# Patient Record
Sex: Female | Born: 1970
Health system: Southern US, Community
[De-identification: ages and names within clinical notes are randomized; demographics above are authoritative.]

## PROBLEM LIST (undated history)

## (undated) ENCOUNTER — Ambulatory Visit: Admission: EM | Payer: Medicare Other | Source: Home / Self Care

## (undated) DIAGNOSIS — R5382 Chronic fatigue, unspecified: Secondary | ICD-10-CM

## (undated) DIAGNOSIS — G9332 Myalgic encephalomyelitis/chronic fatigue syndrome: Secondary | ICD-10-CM

## (undated) DIAGNOSIS — F32A Depression, unspecified: Secondary | ICD-10-CM

## (undated) DIAGNOSIS — J45909 Unspecified asthma, uncomplicated: Secondary | ICD-10-CM

## (undated) DIAGNOSIS — M797 Fibromyalgia: Secondary | ICD-10-CM

## (undated) DIAGNOSIS — L292 Pruritus vulvae: Secondary | ICD-10-CM

## (undated) DIAGNOSIS — N870 Mild cervical dysplasia: Secondary | ICD-10-CM

## (undated) DIAGNOSIS — F419 Anxiety disorder, unspecified: Secondary | ICD-10-CM

## (undated) DIAGNOSIS — F329 Major depressive disorder, single episode, unspecified: Secondary | ICD-10-CM

## (undated) DIAGNOSIS — K649 Unspecified hemorrhoids: Secondary | ICD-10-CM

## (undated) HISTORY — DX: Unspecified asthma, uncomplicated: J45.909

## (undated) HISTORY — DX: Anxiety disorder, unspecified: F41.9

## (undated) HISTORY — DX: Fibromyalgia: M79.7

## (undated) HISTORY — DX: Chronic fatigue, unspecified: R53.82

## (undated) HISTORY — DX: Major depressive disorder, single episode, unspecified: F32.9

## (undated) HISTORY — DX: Myalgic encephalomyelitis/chronic fatigue syndrome: G93.32

## (undated) HISTORY — DX: Unspecified hemorrhoids: K64.9

## (undated) HISTORY — DX: Depression, unspecified: F32.A

## (undated) HISTORY — DX: Pruritus vulvae: L29.2

## (undated) HISTORY — DX: Mild cervical dysplasia: N87.0

---

## 1995-11-12 HISTORY — PX: DILATION AND CURETTAGE OF UTERUS: SHX78

## 2000-11-11 DIAGNOSIS — N870 Mild cervical dysplasia: Secondary | ICD-10-CM

## 2000-11-11 HISTORY — PX: COLPOSCOPY: SHX161

## 2000-11-11 HISTORY — DX: Mild cervical dysplasia: N87.0

## 2004-01-27 ENCOUNTER — Ambulatory Visit: Payer: Self-pay | Admitting: Psychology

## 2004-02-22 ENCOUNTER — Ambulatory Visit: Payer: Self-pay | Admitting: Psychology

## 2004-03-26 ENCOUNTER — Other Ambulatory Visit: Admission: RE | Admit: 2004-03-26 | Discharge: 2004-03-26 | Payer: Self-pay | Admitting: Obstetrics and Gynecology

## 2004-04-09 ENCOUNTER — Ambulatory Visit: Payer: Self-pay | Admitting: Psychology

## 2004-04-24 ENCOUNTER — Ambulatory Visit: Payer: Self-pay | Admitting: Psychology

## 2004-05-17 ENCOUNTER — Ambulatory Visit: Payer: Self-pay | Admitting: Psychology

## 2004-06-14 ENCOUNTER — Ambulatory Visit: Payer: Self-pay | Admitting: Psychology

## 2004-07-17 ENCOUNTER — Ambulatory Visit: Payer: Self-pay | Admitting: Psychology

## 2004-08-14 ENCOUNTER — Ambulatory Visit: Payer: Self-pay | Admitting: Psychology

## 2004-09-18 ENCOUNTER — Ambulatory Visit: Payer: Self-pay | Admitting: Psychology

## 2004-10-12 ENCOUNTER — Ambulatory Visit: Payer: Self-pay | Admitting: Psychology

## 2004-10-18 ENCOUNTER — Encounter: Admission: RE | Admit: 2004-10-18 | Discharge: 2004-10-18 | Payer: Self-pay | Admitting: Rheumatology

## 2005-03-08 ENCOUNTER — Ambulatory Visit: Payer: Self-pay | Admitting: Psychology

## 2005-04-22 ENCOUNTER — Emergency Department (HOSPITAL_COMMUNITY): Admission: EM | Admit: 2005-04-22 | Discharge: 2005-04-23 | Payer: Self-pay | Admitting: Emergency Medicine

## 2005-05-21 ENCOUNTER — Ambulatory Visit: Payer: Self-pay | Admitting: Psychology

## 2005-06-25 ENCOUNTER — Ambulatory Visit: Payer: Self-pay | Admitting: Psychology

## 2005-08-14 ENCOUNTER — Ambulatory Visit: Payer: Self-pay | Admitting: Psychology

## 2005-09-19 ENCOUNTER — Ambulatory Visit: Payer: Self-pay | Admitting: Psychology

## 2005-11-18 ENCOUNTER — Ambulatory Visit: Payer: Self-pay | Admitting: Psychology

## 2006-06-12 ENCOUNTER — Ambulatory Visit (HOSPITAL_COMMUNITY): Payer: Self-pay | Admitting: Psychology

## 2006-12-15 ENCOUNTER — Ambulatory Visit (HOSPITAL_COMMUNITY): Payer: Self-pay | Admitting: Psychology

## 2007-11-24 ENCOUNTER — Ambulatory Visit (HOSPITAL_COMMUNITY): Payer: Self-pay | Admitting: Psychology

## 2008-03-16 ENCOUNTER — Ambulatory Visit (HOSPITAL_COMMUNITY): Payer: Self-pay | Admitting: Psychology

## 2008-05-25 ENCOUNTER — Ambulatory Visit (HOSPITAL_COMMUNITY): Payer: Self-pay | Admitting: Psychology

## 2009-01-31 ENCOUNTER — Encounter: Payer: Self-pay | Admitting: Gynecology

## 2009-01-31 ENCOUNTER — Other Ambulatory Visit: Admission: RE | Admit: 2009-01-31 | Discharge: 2009-01-31 | Payer: Self-pay | Admitting: Gynecology

## 2009-01-31 ENCOUNTER — Ambulatory Visit: Payer: Self-pay | Admitting: Gynecology

## 2009-03-13 ENCOUNTER — Ambulatory Visit (HOSPITAL_COMMUNITY): Payer: Self-pay | Admitting: Psychology

## 2009-05-11 ENCOUNTER — Ambulatory Visit (HOSPITAL_COMMUNITY): Payer: Self-pay | Admitting: Psychology

## 2009-10-18 ENCOUNTER — Ambulatory Visit (HOSPITAL_COMMUNITY): Payer: Self-pay | Admitting: Psychology

## 2009-10-18 ENCOUNTER — Emergency Department (HOSPITAL_COMMUNITY): Admission: EM | Admit: 2009-10-18 | Discharge: 2009-10-18 | Payer: Self-pay | Admitting: Family Medicine

## 2010-01-12 ENCOUNTER — Ambulatory Visit (HOSPITAL_COMMUNITY): Payer: Self-pay | Admitting: Psychology

## 2010-04-11 ENCOUNTER — Ambulatory Visit (HOSPITAL_COMMUNITY): Payer: Self-pay | Admitting: Psychology

## 2010-05-23 ENCOUNTER — Ambulatory Visit: Payer: Self-pay | Admitting: Gynecology

## 2010-05-23 ENCOUNTER — Other Ambulatory Visit: Admission: RE | Admit: 2010-05-23 | Discharge: 2010-05-23 | Payer: Self-pay | Admitting: Gynecology

## 2010-06-13 ENCOUNTER — Ambulatory Visit (HOSPITAL_COMMUNITY): Payer: Self-pay | Admitting: Psychology

## 2010-09-07 ENCOUNTER — Ambulatory Visit (HOSPITAL_COMMUNITY): Payer: Self-pay | Admitting: Psychology

## 2010-09-26 ENCOUNTER — Ambulatory Visit: Payer: Self-pay | Admitting: Gynecology

## 2010-11-13 ENCOUNTER — Ambulatory Visit (HOSPITAL_COMMUNITY)
Admission: RE | Admit: 2010-11-13 | Discharge: 2010-11-13 | Payer: Self-pay | Source: Home / Self Care | Attending: Psychology | Admitting: Psychology

## 2010-11-19 ENCOUNTER — Ambulatory Visit (HOSPITAL_COMMUNITY)
Admission: RE | Admit: 2010-11-19 | Discharge: 2010-11-19 | Payer: Self-pay | Source: Home / Self Care | Attending: Gynecology | Admitting: Gynecology

## 2010-11-20 ENCOUNTER — Ambulatory Visit (HOSPITAL_COMMUNITY)
Admission: RE | Admit: 2010-11-20 | Discharge: 2010-11-20 | Payer: Self-pay | Source: Home / Self Care | Attending: Psychology | Admitting: Psychology

## 2010-12-24 ENCOUNTER — Encounter (HOSPITAL_COMMUNITY): Payer: Self-pay | Admitting: Psychology

## 2011-02-12 ENCOUNTER — Encounter (INDEPENDENT_AMBULATORY_CARE_PROVIDER_SITE_OTHER): Payer: Self-pay | Admitting: Psychology

## 2011-02-12 DIAGNOSIS — F068 Other specified mental disorders due to known physiological condition: Secondary | ICD-10-CM

## 2011-02-12 DIAGNOSIS — F39 Unspecified mood [affective] disorder: Secondary | ICD-10-CM

## 2011-02-12 LAB — POCT RAPID STREP A (OFFICE): Streptococcus, Group A Screen (Direct): NEGATIVE

## 2011-03-22 ENCOUNTER — Other Ambulatory Visit: Payer: Self-pay | Admitting: Dermatology

## 2011-06-10 ENCOUNTER — Encounter (INDEPENDENT_AMBULATORY_CARE_PROVIDER_SITE_OTHER): Payer: Medicare Other | Admitting: Psychology

## 2011-06-10 DIAGNOSIS — F39 Unspecified mood [affective] disorder: Secondary | ICD-10-CM

## 2011-06-10 DIAGNOSIS — F068 Other specified mental disorders due to known physiological condition: Secondary | ICD-10-CM

## 2011-07-02 ENCOUNTER — Ambulatory Visit (INDEPENDENT_AMBULATORY_CARE_PROVIDER_SITE_OTHER): Payer: Medicare Other | Admitting: Gynecology

## 2011-07-02 ENCOUNTER — Other Ambulatory Visit (HOSPITAL_COMMUNITY)
Admission: RE | Admit: 2011-07-02 | Discharge: 2011-07-02 | Disposition: A | Discharge status: Home / Self Care | Payer: Medicare Other | Source: Ambulatory Visit | Attending: Gynecology | Admitting: Gynecology

## 2011-07-02 ENCOUNTER — Encounter: Payer: Self-pay | Admitting: Gynecology

## 2011-07-02 VITALS — BP 110/74 | Ht 63.0 in | Wt 150.0 lb

## 2011-07-02 DIAGNOSIS — F411 Generalized anxiety disorder: Secondary | ICD-10-CM

## 2011-07-02 DIAGNOSIS — Z9189 Other specified personal risk factors, not elsewhere classified: Secondary | ICD-10-CM

## 2011-07-02 DIAGNOSIS — Z131 Encounter for screening for diabetes mellitus: Secondary | ICD-10-CM

## 2011-07-02 DIAGNOSIS — F419 Anxiety disorder, unspecified: Secondary | ICD-10-CM

## 2011-07-02 DIAGNOSIS — Z01419 Encounter for gynecological examination (general) (routine) without abnormal findings: Secondary | ICD-10-CM

## 2011-07-02 DIAGNOSIS — E663 Overweight: Secondary | ICD-10-CM

## 2011-07-02 DIAGNOSIS — Z124 Encounter for screening for malignant neoplasm of cervix: Secondary | ICD-10-CM | POA: Insufficient documentation

## 2011-07-02 DIAGNOSIS — Z87410 Personal history of cervical dysplasia: Secondary | ICD-10-CM

## 2011-07-02 DIAGNOSIS — Z8669 Personal history of other diseases of the nervous system and sense organs: Secondary | ICD-10-CM

## 2011-07-02 DIAGNOSIS — R635 Abnormal weight gain: Secondary | ICD-10-CM

## 2011-07-02 DIAGNOSIS — N9089 Other specified noninflammatory disorders of vulva and perineum: Secondary | ICD-10-CM

## 2011-07-02 DIAGNOSIS — Z13220 Encounter for screening for lipoid disorders: Secondary | ICD-10-CM

## 2011-07-02 MED ORDER — ESCITALOPRAM OXALATE 10 MG PO TABS: 10.0000 mg | ORAL_TABLET | Freq: Every day | ORAL | Status: DC

## 2011-07-02 NOTE — Progress Notes (Signed)
Cristina Soto June 08, 1971 [id number]        40 y.o.  for followup.  Recent had an episode where she had some reddish lesions on her skin ultimately saw infectious disease, thought that they were ticks but turned out that they were body lice. She then had followup episodes of crystalline-like mucus passage for which the infectious disease thought were psychogenic in nature. She's doing well now with everything resolved.  She does note migraine headaches that are perimenstrual the she's had in the past and she takes Excedrin headache and that seems to take care of it.  They're not associated with aura or other neurologic symptoms. She is also complaining of weight gain 10-15 pounds in the past year but admits to not exercising on a regular basis. She has a skin tag on her vulva she wants to take a look at lastly she is on Lexapro 10 mg daily for anxiety it's working well and she wants to continue on this.  Clarifying her history she does have a history of abnormal Pap smears in the past done at another office approximately 10 years ago which were low-grade dysplasia and she had no significant treatment for them. Her most recent followup Pap smears have all been normal.  Past medical history,surgical history, allergies, family history and social history were all reviewed and documented in the EPIC chart. ROS:  Was performed and pertinent positives and negatives are included in the history.  Exam: chaperone present Filed Vitals:   07/02/11 1409  BP: 110/74   General appearance  Normal Skin grossly normal Head/Neck normal with no cervical or supraclavicular adenopathy thyroid normal Lungs  clear Cardiac RR, without RMG Abdominal  soft, nontender, without masses, organomegaly or hernia Breasts  examined lying and sitting without masses, retractions, discharge or axillary adenopathy. Pelvic  Ext/BUS/vagina  normal small benign appearing skin tag in the left underwear line   Cervix  normal  Pap  done  Uterus  anteverted, normal size, shape and contour, midline and mobile nontender   Adnexa  Without masses or tenderness    Anus and perineum  normal   Rectovaginal  normal sphincter tone without palpated masses or tenderness.    Assessment/Plan:  40 y.o. female for followup.    #1 Weight gain.  Think this is due to lack of exercise and dietary. Recommend TSH rule out thyroid dysfunction but otherwise increase activity with dietary restriction. #2 Skin tag. Patient has benign appearing skin tag left underwear line. It's been stable over observation. Offered excision if she wants otherwise she'll monitor as long as he remains stable she'll follow. #3 Anxiety. Patient is doing well from her posttraumatic stress syndrome is on Lexapro 10 mg daily. I refilled her times a year. #4 Migraine headaches. Patient has perimenstrual migraine headaches that respond to Excedrin headache. No aura or other neurologic symptoms. She has been seen by psychiatry neurology in the past does not feel needs any further evaluation at this time. #5 Health maintenance. Self breast exams on a monthly basis discussed encouraged. Had mammogram January 2012 we'll continue with annual mammograms. Is not sexually active but plans to be in birth control is not an issue. Menses are regular and manageable. Pap smear was done today. She has a history of dysplasia in the past noting most recent Pap smears have all been normal assumed this was normal we'll continue annual cytology.Baseline labs CBC urine analysis glucose lipid profile were ordered along with her TSH. Cristina Soto continues well she'll see  me in a year sooner as needed.     Cristina Lords MD, 3:06 PM 07/02/2011

## 2011-08-12 ENCOUNTER — Encounter (INDEPENDENT_AMBULATORY_CARE_PROVIDER_SITE_OTHER): Payer: Medicare Other | Admitting: Psychology

## 2011-08-12 DIAGNOSIS — F0630 Mood disorder due to known physiological condition, unspecified: Secondary | ICD-10-CM

## 2011-08-12 DIAGNOSIS — F39 Unspecified mood [affective] disorder: Secondary | ICD-10-CM

## 2011-08-27 ENCOUNTER — Encounter (INDEPENDENT_AMBULATORY_CARE_PROVIDER_SITE_OTHER): Payer: MEDICARE | Admitting: Psychology

## 2011-08-27 DIAGNOSIS — F068 Other specified mental disorders due to known physiological condition: Secondary | ICD-10-CM

## 2011-08-27 DIAGNOSIS — F39 Unspecified mood [affective] disorder: Secondary | ICD-10-CM

## 2011-11-20 ENCOUNTER — Ambulatory Visit (INDEPENDENT_AMBULATORY_CARE_PROVIDER_SITE_OTHER): Payer: Medicare Other | Admitting: Psychology

## 2011-11-20 DIAGNOSIS — F064 Anxiety disorder due to known physiological condition: Secondary | ICD-10-CM

## 2011-11-20 DIAGNOSIS — F068 Other specified mental disorders due to known physiological condition: Secondary | ICD-10-CM

## 2011-11-20 DIAGNOSIS — F39 Unspecified mood [affective] disorder: Secondary | ICD-10-CM

## 2011-11-20 DIAGNOSIS — R69 Illness, unspecified: Secondary | ICD-10-CM

## 2011-11-21 ENCOUNTER — Encounter (HOSPITAL_COMMUNITY): Payer: Self-pay | Admitting: Psychology
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
## 2012-02-06 ENCOUNTER — Ambulatory Visit (INDEPENDENT_AMBULATORY_CARE_PROVIDER_SITE_OTHER): Payer: Medicare Other | Admitting: Psychology

## 2012-02-06 DIAGNOSIS — F068 Other specified mental disorders due to known physiological condition: Secondary | ICD-10-CM

## 2012-02-06 DIAGNOSIS — F39 Unspecified mood [affective] disorder: Secondary | ICD-10-CM

## 2012-02-06 DIAGNOSIS — F064 Anxiety disorder due to known physiological condition: Secondary | ICD-10-CM

## 2012-04-01 ENCOUNTER — Ambulatory Visit (INDEPENDENT_AMBULATORY_CARE_PROVIDER_SITE_OTHER): Payer: Medicare Other | Admitting: Gynecology

## 2012-04-01 ENCOUNTER — Encounter: Payer: Self-pay | Admitting: Gynecology

## 2012-04-01 ENCOUNTER — Encounter (HOSPITAL_COMMUNITY): Payer: Self-pay | Admitting: Psychology

## 2012-04-01 DIAGNOSIS — K649 Unspecified hemorrhoids: Secondary | ICD-10-CM | POA: Insufficient documentation
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
## 2012-04-01 NOTE — Progress Notes (Signed)
Patient is a 41 year old who presented to the office today complaining of one-month history of "hemorrhoids". She thought she felt some bulging or sensation in her vaginal area when straining. She denies any hematochezia. On questioning she has 2 family members who have had diverticulitis (father, sister) which resulted in hemicolectomy. Patient stated that she applied several times Preparation H and wanted just to make checked to make sure that was fine. She's not sexually active and is having normal menstrual cycles.  Exam: Pelvic: Bartholin urethra Skene was within normal limits Vagina: No lesions or discharge no evidence of Bartholin cyst Cervix: No lesions or discharge Bimanual exam no palpable masses or tenderness uterus anteverted Rectal: Patient was placed in the knee-chest position and underwent an anoscopic exam with no evidence of internal or external hemorrhoids.  Assessment/plan: Patient with apparent past history of hemorrhoids none present on exam today. She may have treated her self are with Preparation H. We discussed taking MiraLax 1 tablespoon with juice daily and to continue to add fiber, fruit and vegetable as part of her daily diet and to maintain good hydration. She was given Hemoccult cards to submit to the office for testing.

## 2012-04-01 NOTE — Patient Instructions (Signed)

## 2012-06-08 ENCOUNTER — Ambulatory Visit (INDEPENDENT_AMBULATORY_CARE_PROVIDER_SITE_OTHER): Payer: Medicare Other | Admitting: Psychology

## 2012-06-08 DIAGNOSIS — F068 Other specified mental disorders due to known physiological condition: Secondary | ICD-10-CM

## 2012-06-08 DIAGNOSIS — F39 Unspecified mood [affective] disorder: Secondary | ICD-10-CM

## 2012-06-08 DIAGNOSIS — F064 Anxiety disorder due to known physiological condition: Secondary | ICD-10-CM

## 2012-07-02 ENCOUNTER — Encounter (HOSPITAL_COMMUNITY): Payer: Self-pay | Admitting: Psychology
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
## 2012-07-15 ENCOUNTER — Ambulatory Visit (INDEPENDENT_AMBULATORY_CARE_PROVIDER_SITE_OTHER): Payer: Medicare Other | Admitting: Psychology

## 2012-07-15 DIAGNOSIS — F068 Other specified mental disorders due to known physiological condition: Secondary | ICD-10-CM

## 2012-07-15 DIAGNOSIS — F39 Unspecified mood [affective] disorder: Secondary | ICD-10-CM

## 2012-07-15 DIAGNOSIS — F064 Anxiety disorder due to known physiological condition: Secondary | ICD-10-CM

## 2012-07-16 ENCOUNTER — Encounter (HOSPITAL_COMMUNITY): Payer: Self-pay | Admitting: Psychology
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
## 2012-09-14 ENCOUNTER — Ambulatory Visit (HOSPITAL_COMMUNITY): Payer: Self-pay | Admitting: Psychology

## 2012-09-21 ENCOUNTER — Ambulatory Visit (INDEPENDENT_AMBULATORY_CARE_PROVIDER_SITE_OTHER): Payer: Medicare Other | Admitting: Psychology

## 2012-09-21 DIAGNOSIS — F39 Unspecified mood [affective] disorder: Secondary | ICD-10-CM

## 2012-09-21 DIAGNOSIS — F064 Anxiety disorder due to known physiological condition: Secondary | ICD-10-CM

## 2012-10-01 ENCOUNTER — Encounter (HOSPITAL_COMMUNITY): Payer: Self-pay | Admitting: Psychology
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
## 2012-12-23 ENCOUNTER — Ambulatory Visit (HOSPITAL_COMMUNITY): Payer: Self-pay | Admitting: Psychology

## 2012-12-28 ENCOUNTER — Ambulatory Visit (INDEPENDENT_AMBULATORY_CARE_PROVIDER_SITE_OTHER): Payer: No Typology Code available for payment source | Admitting: Psychology

## 2012-12-28 ENCOUNTER — Encounter (HOSPITAL_COMMUNITY): Payer: Self-pay | Admitting: Psychology

## 2012-12-28 DIAGNOSIS — F064 Anxiety disorder due to known physiological condition: Secondary | ICD-10-CM

## 2012-12-28 DIAGNOSIS — F39 Unspecified mood [affective] disorder: Secondary | ICD-10-CM
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
## 2013-03-16 ENCOUNTER — Ambulatory Visit (INDEPENDENT_AMBULATORY_CARE_PROVIDER_SITE_OTHER): Payer: No Typology Code available for payment source | Admitting: Psychology

## 2013-03-16 DIAGNOSIS — F39 Unspecified mood [affective] disorder: Secondary | ICD-10-CM

## 2013-03-16 DIAGNOSIS — F064 Anxiety disorder due to known physiological condition: Secondary | ICD-10-CM

## 2013-03-24 ENCOUNTER — Ambulatory Visit (INDEPENDENT_AMBULATORY_CARE_PROVIDER_SITE_OTHER): Payer: No Typology Code available for payment source | Admitting: Psychology

## 2013-03-24 DIAGNOSIS — F39 Unspecified mood [affective] disorder: Secondary | ICD-10-CM

## 2013-04-01 ENCOUNTER — Encounter (HOSPITAL_COMMUNITY): Payer: Self-pay | Admitting: Psychology
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
## 2013-04-19 ENCOUNTER — Encounter (HOSPITAL_COMMUNITY): Payer: Self-pay | Admitting: Psychology
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
## 2013-05-07 ENCOUNTER — Telehealth: Payer: Self-pay

## 2013-05-07 NOTE — Telephone Encounter (Signed)
Patient c/o x one year lump in abdomen at umbilicus.  Feels like she may have blockage of stool. C/o stool passing "with legs" and "in weird shapes".  Feels pressure at bladder and umbilicus and urine foggy.  Also, complained that last month she had two periods. Had regular period and two weeks later had another. I told patient she needs to schedule office visit. Two years since last CE and she needs to be seen to assess all of these symptoms. I transferred her to Lupita Leash to schedule appt. Patient is currently on vacation and will come in when she returns.

## 2013-05-17 ENCOUNTER — Ambulatory Visit (HOSPITAL_COMMUNITY): Payer: Self-pay | Admitting: Psychology

## 2013-05-18 ENCOUNTER — Ambulatory Visit (INDEPENDENT_AMBULATORY_CARE_PROVIDER_SITE_OTHER): Payer: Medicare Other | Admitting: Gynecology

## 2013-05-18 ENCOUNTER — Encounter: Payer: Self-pay | Admitting: Gynecology

## 2013-05-18 ENCOUNTER — Other Ambulatory Visit (HOSPITAL_COMMUNITY)
Admission: RE | Admit: 2013-05-18 | Discharge: 2013-05-18 | Disposition: A | Discharge status: Home / Self Care | Payer: Medicare Other | Source: Ambulatory Visit | Attending: Gynecology | Admitting: Gynecology

## 2013-05-18 VITALS — BP 114/72 | Ht 63.25 in | Wt 147.0 lb

## 2013-05-18 DIAGNOSIS — N926 Irregular menstruation, unspecified: Secondary | ICD-10-CM

## 2013-05-18 DIAGNOSIS — R109 Unspecified abdominal pain: Secondary | ICD-10-CM

## 2013-05-18 DIAGNOSIS — R102 Pelvic and perineal pain: Secondary | ICD-10-CM

## 2013-05-18 DIAGNOSIS — Z124 Encounter for screening for malignant neoplasm of cervix: Secondary | ICD-10-CM

## 2013-05-18 DIAGNOSIS — R195 Other fecal abnormalities: Secondary | ICD-10-CM

## 2013-05-18 DIAGNOSIS — Z01419 Encounter for gynecological examination (general) (routine) without abnormal findings: Secondary | ICD-10-CM | POA: Insufficient documentation

## 2013-05-18 DIAGNOSIS — N9489 Other specified conditions associated with female genital organs and menstrual cycle: Secondary | ICD-10-CM

## 2013-05-18 DIAGNOSIS — Z13220 Encounter for screening for lipoid disorders: Secondary | ICD-10-CM

## 2013-05-18 LAB — CBC WITH DIFFERENTIAL/PLATELET
Basophils Absolute: 0.1 10*3/uL (ref 0.0–0.1)
Basophils Relative: 1 % (ref 0–1)
Eosinophils Absolute: 0.2 10*3/uL (ref 0.0–0.7)
Eosinophils Relative: 3 % (ref 0–5)
Hemoglobin: 12.3 g/dL (ref 12.0–15.0)
Lymphocytes Relative: 24 % (ref 12–46)
Lymphs Abs: 1.4 10*3/uL (ref 0.7–4.0)
MCH: 27.9 pg (ref 26.0–34.0)
MCHC: 32.7 g/dL (ref 30.0–36.0)
MCV: 85.3 fL (ref 78.0–100.0)
Monocytes Absolute: 0.7 10*3/uL (ref 0.1–1.0)
Monocytes Relative: 13 % — ABNORMAL HIGH (ref 3–12)
Neutro Abs: 3.4 10*3/uL (ref 1.7–7.7)
Neutrophils Relative %: 59 % (ref 43–77)
Platelets: 298 10*3/uL (ref 150–400)
RBC: 4.41 MIL/uL (ref 3.87–5.11)
WBC: 5.8 10*3/uL (ref 4.0–10.5)

## 2013-05-18 LAB — URINALYSIS W MICROSCOPIC + REFLEX CULTURE
Glucose, UA: NEGATIVE mg/dL
Hgb urine dipstick: NEGATIVE
Ketones, ur: NEGATIVE mg/dL
Nitrite: NEGATIVE
Protein, ur: NEGATIVE mg/dL
Specific Gravity, Urine: 1.015 (ref 1.005–1.030)
Urobilinogen, UA: 0.2 mg/dL (ref 0.0–1.0)
pH: 7 (ref 5.0–8.0)

## 2013-05-18 NOTE — Patient Instructions (Signed)
Follow up for ultrasound as scheduled 

## 2013-05-18 NOTE — Progress Notes (Signed)
Cristina Soto [birth year] [id number]        42 y.o.  G1P0010 presents having not been seen in 2 years with several issues noted below.  Past medical history,surgical history, medications, allergies, family history and social history were all reviewed and documented in the EPIC chart.  ROS:  Performed and pertinent positives and negatives are included in the history, assessment and plan .  Exam: Cristina Soto assistant Filed Vitals:   05/18/13 1427  BP: 114/72  Height: 5' 3.25" (1.607 m)  Weight: 147 lb (66.679 kg)   General appearance  Normal Skin grossly normal Head/Neck normal with no cervical or supraclavicular adenopathy thyroid normal Lungs  clear Cardiac RR, without RMG Abdominal  soft, nontender, without masses, organomegaly or hernia Breasts  examined lying and sitting without masses, retractions, discharge or axillary adenopathy. Pelvic  Ext/BUS/vagina  normal   Cervix  normal Pap/HPV  Uterus  axial, normal size, shape and contour, midline and mobile nontender   Adnexa  Without masses or tenderness    Anus and perineum  normal   Rectovaginal  normal sphincter tone without palpated masses or tenderness.    Assessment/Plan:  42 y.o. G45P0010 female for annual exam.   1. Abdominal pain/pelvic pressure/change in bowel habit. Patient notes over the last year or so her stools or change calipered where they will be pencil thin but other times larger. Feels like there is some obstruction within a finger or so in the rectum. No blood, mucus, diarrhea or chronic constipation. No significant nausea vomiting or upper abdominal discomfort. She feels a more diffuse mid to lower abdominal discomfort both right and left quadrants. Also notes some suprapubic tenderness. Exam today is normal.  Start with GYN ultrasound and then probable referral to gastroenterology. Urinalysis is normal today. 2. Early menses this month. She started her menses 2 weeks early. Otherwise has been having regular  menses. Interestingly she started at the same time as her sister did as they were on vacation together. No weight changes hair changes skin changes. We'll check baseline TSH prolactin but otherwise will monitor at present. As long as she has regular menses going forward will follow. Continue her regularity she'll represent for further evaluation. 3. Hair around her nipples. Patient notes she's had hair around her nipples always but has a few extra hairs. Exam today is normal with several terminal hair growths around both nipples. No mid chest hair growth, chin or facial hair. I reassured patient this is not unusual at this point we'll follow. If she has continued growth she knows to call and we will pursue androgen studies. 4. Mammography 2012. Recommend scheduling mammogram now she agrees to do so. SBE monthly review. 5. Pap smear 2012. Pap/HPV today. History of low-grade dysplasia 2002. Followup Pap smears have been normal. Discussed less frequent screening intervals assuming this Pap smear is normal. 6. Health maintenance. Check baseline CBC comprehensive metabolic panel lipid profile along with her other labs noted above. Followup for ultrasound and we'll go from there.  Note: This document was prepared with digital dictation and possible smart phrase technology. Any transcriptional errors that result from this process are unintentional.   Cristina Lords MD, 2:56 PM 05/18/2013

## 2013-05-19 ENCOUNTER — Ambulatory Visit (INDEPENDENT_AMBULATORY_CARE_PROVIDER_SITE_OTHER): Payer: No Typology Code available for payment source | Admitting: Psychology

## 2013-05-19 ENCOUNTER — Encounter (HOSPITAL_COMMUNITY): Payer: Self-pay | Admitting: Psychology

## 2013-05-19 DIAGNOSIS — F39 Unspecified mood [affective] disorder: Secondary | ICD-10-CM

## 2013-05-19 DIAGNOSIS — F064 Anxiety disorder due to known physiological condition: Secondary | ICD-10-CM

## 2013-05-19 LAB — COMPREHENSIVE METABOLIC PANEL
ALT: 11 U/L (ref 0–35)
AST: 18 U/L (ref 0–37)
Alkaline Phosphatase: 56 U/L (ref 39–117)
CO2: 28 mEq/L (ref 19–32)
Calcium: 9.2 mg/dL (ref 8.4–10.5)
Chloride: 105 mEq/L (ref 96–112)
Creat: 0.72 mg/dL (ref 0.50–1.10)
Sodium: 140 mEq/L (ref 135–145)
Total Bilirubin: 0.3 mg/dL (ref 0.3–1.2)
Total Protein: 6.9 g/dL (ref 6.0–8.3)

## 2013-05-19 LAB — LIPID PANEL
Cholesterol: 146 mg/dL (ref 0–200)
LDL Cholesterol: 100 mg/dL — ABNORMAL HIGH (ref 0–99)
VLDL: 9 mg/dL (ref 0–40)

## 2013-05-19 LAB — PROLACTIN: Prolactin: 9 ng/mL

## 2013-05-19 LAB — TSH: TSH: 3.429 u[IU]/mL (ref 0.350–4.500)
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
## 2013-06-02 ENCOUNTER — Other Ambulatory Visit: Payer: Medicare Other

## 2013-06-02 ENCOUNTER — Ambulatory Visit: Payer: Medicare Other | Admitting: Gynecology

## 2013-06-07 ENCOUNTER — Encounter: Payer: Self-pay | Admitting: Gynecology

## 2013-06-09 ENCOUNTER — Encounter: Payer: Self-pay | Admitting: Gynecology

## 2013-06-16 ENCOUNTER — Ambulatory Visit (INDEPENDENT_AMBULATORY_CARE_PROVIDER_SITE_OTHER): Payer: Self-pay | Admitting: Gynecology

## 2013-06-16 ENCOUNTER — Encounter: Payer: Self-pay | Admitting: Gynecology

## 2013-06-16 ENCOUNTER — Ambulatory Visit (INDEPENDENT_AMBULATORY_CARE_PROVIDER_SITE_OTHER): Payer: Medicare Other

## 2013-06-16 DIAGNOSIS — R109 Unspecified abdominal pain: Secondary | ICD-10-CM

## 2013-06-16 DIAGNOSIS — N926 Irregular menstruation, unspecified: Secondary | ICD-10-CM

## 2013-06-16 DIAGNOSIS — N854 Malposition of uterus: Secondary | ICD-10-CM

## 2013-06-16 DIAGNOSIS — N8300 Follicular cyst of ovary, unspecified side: Secondary | ICD-10-CM

## 2013-06-16 NOTE — Patient Instructions (Signed)
Followup if pain would return for gastroenterology referral. Otherwise followup routinely

## 2013-06-16 NOTE — Progress Notes (Addendum)
Patient presents for ultrasound with history of abdominal pain/pelvic pressure/change in bowel habit. Patient notes over the last year or so her stools or change calipered where they will be pencil thin but other times larger. Feels like there is some obstruction within a finger or so in the rectum. No blood, mucus, diarrhea or chronic constipation. No significant nausea vomiting or upper abdominal discomfort. She feels a more diffuse mid to lower abdominal discomfort both right and left quadrants. Also notes some suprapubic tenderness.  Ultrasound shows uterus overall normal in size and echotexture. Endometrial echo 8.0 mm. Right and left ovaries visualized with physiologic changes. Mild cul-de-sac fluid noted.  Assessment and plan: Abdominal pain and history as noted above. This actually has all resolved over the past month since she started a wheat free diet and started exercising on a regular basis. Patient notes that she is no longer having abdominal pain now her bowel movements are normal. Still offered GI referral but she declined. She would prefer to monitor as long she feels normal then she wants to follow expectantly. Certainly think this is reasonable at this point she does note any of her GI symptoms returned her call me I will refer her to a gastroenterologist. Otherwise she will followup with me for her annual exam when due.

## 2013-08-19 ENCOUNTER — Telehealth: Payer: Self-pay | Admitting: *Deleted

## 2013-08-19 ENCOUNTER — Ambulatory Visit (INDEPENDENT_AMBULATORY_CARE_PROVIDER_SITE_OTHER): Payer: Medicare Other | Admitting: Gynecology

## 2013-08-19 ENCOUNTER — Encounter: Payer: Self-pay | Admitting: Gynecology

## 2013-08-19 DIAGNOSIS — N644 Mastodynia: Secondary | ICD-10-CM

## 2013-08-19 DIAGNOSIS — N6310 Unspecified lump in the right breast, unspecified quadrant: Secondary | ICD-10-CM

## 2013-08-19 NOTE — Progress Notes (Signed)
Patient presents complaining of a palpable ridge/mass right breast. Notes since recent change in diet where she is gluten-free she has lost weight and has felt more lumps and bumps in her breasts that come and go with her menstrual cycle. The area in her right breast has not resolved since her menses. It is tender. No nipple discharge or other masses palpable.  Exam was [employer] Both breast examined lying and sitting without clear masses retractions discharge adenopathy. The area the patient is pointing to in the right breast is the junction of the lateral pectoralis edge and her breast tissue.  No masses retractions in this area.  Assessment and plan: Probable physiologic changes right breast. She is overdue for her mammogram and we'll plan on diagnostic mammography and ultrasound of this area just to be sure. Patient agrees with the plan and knows to expect our phone call.

## 2013-08-19 NOTE — Telephone Encounter (Signed)
Message copied by Aura Camps on Thu Aug 19, 2013  3:34 PM ------      Message from: Dara Lords      Created: Thu Aug 19, 2013  3:24 PM       Schedule diagnostic mammography and ultrasound reference patient felt mass right breast 11:00 position periphery of the breast. Exam normal to the physician ------

## 2013-08-19 NOTE — Telephone Encounter (Signed)
Order placed for the below, breast center will contact pt.

## 2013-08-19 NOTE — Patient Instructions (Signed)
Office will call you to arrange mammography/ultrasound of the breast.

## 2013-08-24 NOTE — Telephone Encounter (Signed)
appt 09/01/13

## 2013-09-01 ENCOUNTER — Other Ambulatory Visit: Payer: Self-pay

## 2013-09-15 ENCOUNTER — Ambulatory Visit
Admission: RE | Admit: 2013-09-15 | Discharge: 2013-09-15 | Disposition: A | Discharge status: Home / Self Care | Payer: Medicare Other | Source: Ambulatory Visit | Attending: Gynecology | Admitting: Gynecology

## 2013-09-15 DIAGNOSIS — N6310 Unspecified lump in the right breast, unspecified quadrant: Secondary | ICD-10-CM

## 2013-09-16 ENCOUNTER — Other Ambulatory Visit: Payer: Self-pay

## 2013-11-23 ENCOUNTER — Ambulatory Visit (INDEPENDENT_AMBULATORY_CARE_PROVIDER_SITE_OTHER): Payer: No Typology Code available for payment source | Admitting: Psychology

## 2013-11-23 DIAGNOSIS — F39 Unspecified mood [affective] disorder: Secondary | ICD-10-CM

## 2013-11-23 DIAGNOSIS — F064 Anxiety disorder due to known physiological condition: Secondary | ICD-10-CM

## 2013-12-27 ENCOUNTER — Encounter (HOSPITAL_COMMUNITY): Payer: Self-pay | Admitting: Psychology
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
## 2014-01-04 ENCOUNTER — Ambulatory Visit (INDEPENDENT_AMBULATORY_CARE_PROVIDER_SITE_OTHER): Payer: No Typology Code available for payment source | Admitting: Psychology

## 2014-01-04 ENCOUNTER — Ambulatory Visit (HOSPITAL_COMMUNITY): Payer: Self-pay | Admitting: Psychology

## 2014-01-04 DIAGNOSIS — F39 Unspecified mood [affective] disorder: Secondary | ICD-10-CM

## 2014-01-04 DIAGNOSIS — F064 Anxiety disorder due to known physiological condition: Secondary | ICD-10-CM

## 2014-01-07 ENCOUNTER — Encounter (HOSPITAL_COMMUNITY): Payer: Self-pay | Admitting: Psychology
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
## 2014-01-25 ENCOUNTER — Ambulatory Visit (INDEPENDENT_AMBULATORY_CARE_PROVIDER_SITE_OTHER): Payer: Medicare Other | Admitting: Psychology

## 2014-01-25 DIAGNOSIS — F064 Anxiety disorder due to known physiological condition: Secondary | ICD-10-CM

## 2014-01-25 DIAGNOSIS — F39 Unspecified mood [affective] disorder: Secondary | ICD-10-CM

## 2014-02-03 ENCOUNTER — Encounter (HOSPITAL_COMMUNITY): Payer: Self-pay | Admitting: Psychology
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
## 2014-04-25 ENCOUNTER — Telehealth (HOSPITAL_COMMUNITY): Payer: Self-pay | Admitting: *Deleted

## 2014-04-28 ENCOUNTER — Ambulatory Visit (INDEPENDENT_AMBULATORY_CARE_PROVIDER_SITE_OTHER): Payer: No Typology Code available for payment source | Admitting: Psychology

## 2014-04-28 DIAGNOSIS — F39 Unspecified mood [affective] disorder: Secondary | ICD-10-CM

## 2014-04-28 DIAGNOSIS — F064 Anxiety disorder due to known physiological condition: Secondary | ICD-10-CM

## 2014-05-06 ENCOUNTER — Encounter (HOSPITAL_COMMUNITY): Payer: Self-pay | Admitting: Psychology
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
## 2014-05-09 ENCOUNTER — Ambulatory Visit (HOSPITAL_COMMUNITY): Payer: Self-pay | Admitting: Psychology

## 2014-05-20 ENCOUNTER — Encounter: Payer: Self-pay | Admitting: Gynecology

## 2014-05-20 ENCOUNTER — Ambulatory Visit (INDEPENDENT_AMBULATORY_CARE_PROVIDER_SITE_OTHER): Payer: Medicare Other | Admitting: Gynecology

## 2014-05-20 VITALS — BP 114/70 | Ht 64.0 in | Wt 143.0 lb

## 2014-05-20 DIAGNOSIS — L292 Pruritus vulvae: Secondary | ICD-10-CM

## 2014-05-20 DIAGNOSIS — K649 Unspecified hemorrhoids: Secondary | ICD-10-CM

## 2014-05-20 DIAGNOSIS — G43909 Migraine, unspecified, not intractable, without status migrainosus: Secondary | ICD-10-CM

## 2014-05-20 DIAGNOSIS — L293 Anogenital pruritus, unspecified: Secondary | ICD-10-CM

## 2014-05-20 MED ORDER — NYSTATIN-TRIAMCINOLONE 100000-0.1 UNIT/GM-% EX OINT: 1.0000 "application " | TOPICAL_OINTMENT | Freq: Two times a day (BID) | CUTANEOUS | Status: DC

## 2014-05-20 NOTE — Progress Notes (Signed)
Cristina Soto 1971/09/04 [id number]        43 y.o.  G1P0010 for followup exam. Several issues as below.  Past medical history,surgical history, problem list, medications, allergies, family history and social history were all reviewed and documented as reviewed in the EPIC chart.  ROS:  12 system ROS performed with pertinent positives and negatives included in the history, assessment and plan.   Additional significant findings :  None   Exam: [facility] personassistant Filed Vitals:   05/20/14 1111  BP: 114/70  Height: 5\' 4"  (1.626 m)  Weight: 143 lb (64.864 kg)   General appearance:  Normal affect, orientation and appearance. Skin: Grossly normal HEENT: Without gross lesions.  No cervical or supraclavicular adenopathy. Thyroid normal.  Lungs:  Clear without wheezing, rales or rhonchi Cardiac: RR, without RMG Abdominal:  Soft, nontender, without masses, guarding, rebound, organomegaly or hernia Breasts:  Examined lying and sitting without masses, retractions, discharge or axillary adenopathy. Pelvic:  Ext/BUS/vagina normal  Cervix normal  Uterus anteverted, normal size, shape and contour, midline and mobile nontender   Adnexa  Without masses or tenderness    Anus and perineum  Normal with old external hemorrhoid  Rectovaginal  Normal sphincter tone without palpated masses or tenderness.    Assessment/Plan:  43 y.o. 561P0010 female for followup exam with regular menses, not sexually active.   1. Contraception. Not required as she is not sexually active and does not plan to be. Will present for discussion if she chooses to become sexually active. 2. Menstrual migraines. Patient is having some migraine headaches related to the onset of her menses. Has been going on for years. Followed by her primary physician. 3. Hemorrhoids. Has occasional flares. Recommended OTC products. Call if these do not seem to help for prescription products. 4. Mild vulvitis intermittently treated with Mycolog cream.  No symptoms today and exam is normal. Refill of Mycolog cream x1 to use when necessary. 5. Mammography 09/2013. Repeat this coming fall. SBE monthly reviewed. 6. Pap smear 2014. I have noted it was with HPV but it was not done with HPV. Plan repeat at 3 year interval. No history of significant abnormal Pap smears previously. 7. Health maintenance. No blood work done as she is in the process of establishing care with a new physician to follow her fibromyalgia and migraines and will have routine blood work done through their office. Followup in one year, sooner as needed.   Note: This document was prepared with digital dictation and possible smart phrase technology. Any transcriptional errors that result from this process are unintentional.   Dara LordsFONTAINE,Rutledge Selsor P MD, 12:21 PM 05/20/2014

## 2014-05-20 NOTE — Patient Instructions (Signed)
Follow up in one year, sooner if any issues.  You may obtain a copy of any labs that were done today by logging onto MyChart as outlined in the instructions provided with your AVS (after visit summary). The office will not call with normal lab results but certainly if there are any significant abnormalities then we will contact you.   Health Maintenance, Female A healthy lifestyle and preventative care can promote health and wellness.  Maintain regular health, dental, and eye exams.  Eat a healthy diet. Foods like vegetables, fruits, whole grains, low-fat dairy products, and lean protein foods contain the nutrients you need without too many calories. Decrease your intake of foods high in solid fats, added sugars, and salt. Get information about a proper diet from your caregiver, if necessary.  Regular physical exercise is one of the most important things you can do for your health. Most adults should get at least 150 minutes of moderate-intensity exercise (any activity that increases your heart rate and causes you to sweat) each week. In addition, most adults need muscle-strengthening exercises on 2 or more days a week.   Maintain a healthy weight. The body mass index (BMI) is a screening tool to identify possible weight problems. It provides an estimate of body fat based on height and weight. Your caregiver can help determine your BMI, and can help you achieve or maintain a healthy weight. For adults 20 years and older:  A BMI below 18.5 is considered underweight.  A BMI of 18.5 to 24.9 is normal.  A BMI of 25 to 29.9 is considered overweight.  A BMI of 30 and above is considered obese.  Maintain normal blood lipids and cholesterol by exercising and minimizing your intake of saturated fat. Eat a balanced diet with plenty of fruits and vegetables. Blood tests for lipids and cholesterol should begin at age 20 and be repeated every 5 years. If your lipid or cholesterol levels are high, you are  over 50, or you are a high risk for heart disease, you may need your cholesterol levels checked more frequently.Ongoing high lipid and cholesterol levels should be treated with medicines if diet and exercise are not effective.  If you smoke, find out from your caregiver how to quit. If you do not use tobacco, do not start.  Lung cancer screening is recommended for adults aged 55 80 years who are at high risk for developing lung cancer because of a history of smoking. Yearly low-dose computed tomography (CT) is recommended for people who have at least a 30-pack-year history of smoking and are a current smoker or have quit within the past 15 years. A pack year of smoking is smoking an average of 1 pack of cigarettes a day for 1 year (for example: 1 pack a day for 30 years or 2 packs a day for 15 years). Yearly screening should continue until the smoker has stopped smoking for at least 15 years. Yearly screening should also be stopped for people who develop a health problem that would prevent them from having lung cancer treatment.  If you are pregnant, do not drink alcohol. If you are breastfeeding, be very cautious about drinking alcohol. If you are not pregnant and choose to drink alcohol, do not exceed 1 drink per day. One drink is considered to be 12 ounces (355 mL) of beer, 5 ounces (148 mL) of wine, or 1.5 ounces (44 mL) of liquor.  Avoid use of street drugs. Do not share needles with anyone. Ask   help if you need support or instructions about stopping the use of drugs.  High blood pressure causes heart disease and increases the risk of stroke. Blood pressure should be checked at least every 1 to 2 years. Ongoing high blood pressure should be treated with medicines, if weight loss and exercise are not effective.  If you are 55 to 44 years old, ask your caregiver if you should take aspirin to prevent strokes.  Diabetes screening involves taking a blood sample to check your fasting blood sugar  level. This should be done once every 3 years, after age 45, if you are within normal weight and without risk factors for diabetes. Testing should be considered at a younger age or be carried out more frequently if you are overweight and have at least 1 risk factor for diabetes.  Breast cancer screening is essential preventative care for women. You should practice "breast self-awareness." This means understanding the normal appearance and feel of your breasts and may include breast self-examination. Any changes detected, no matter how small, should be reported to a caregiver. Women in their 20s and 30s should have a clinical breast exam (CBE) by a caregiver as part of a regular health exam every 1 to 3 years. After age 40, women should have a CBE every year. Starting at age 40, women should consider having a mammogram (breast X-ray) every year. Women who have a family history of breast cancer should talk to their caregiver about genetic screening. Women at a high risk of breast cancer should talk to their caregiver about having an MRI and a mammogram every year.  Breast cancer gene (BRCA)-related cancer risk assessment is recommended for women who have family members with BRCA-related cancers. BRCA-related cancers include breast, ovarian, tubal, and peritoneal cancers. Having family members with these cancers may be associated with an increased risk for harmful changes (mutations) in the breast cancer genes BRCA1 and BRCA2. Results of the assessment will determine the need for genetic counseling and BRCA1 and BRCA2 testing.  The Pap test is a screening test for cervical cancer. Women should have a Pap test starting at age 21. Between ages 21 and 29, Pap tests should be repeated every 2 years. Beginning at age 30, you should have a Pap test every 3 years as long as the past 3 Pap tests have been normal. If you had a hysterectomy for a problem that was not cancer or a condition that could lead to cancer, then  you no longer need Pap tests. If you are between ages 65 and 70, and you have had normal Pap tests going back 10 years, you no longer need Pap tests. If you have had past treatment for cervical cancer or a condition that could lead to cancer, you need Pap tests and screening for cancer for at least 20 years after your treatment. If Pap tests have been discontinued, risk factors (such as a new sexual partner) need to be reassessed to determine if screening should be resumed. Some women have medical problems that increase the chance of getting cervical cancer. In these cases, your caregiver may recommend more frequent screening and Pap tests.  The human papillomavirus (HPV) test is an additional test that may be used for cervical cancer screening. The HPV test looks for the virus that can cause the cell changes on the cervix. The cells collected during the Pap test can be tested for HPV. The HPV test could be used to screen women aged 30 years and older,   and should be used in women of any age who have unclear Pap test results. After the age of 30, women should have HPV testing at the same frequency as a Pap test.  Colorectal cancer can be detected and often prevented. Most routine colorectal cancer screening begins at the age of 50 and continues through age 75. However, your caregiver may recommend screening at an earlier age if you have risk factors for colon cancer. On a yearly basis, your caregiver may provide home test kits to check for hidden blood in the stool. Use of a small camera at the end of a tube, to directly examine the colon (sigmoidoscopy or colonoscopy), can detect the earliest forms of colorectal cancer. Talk to your caregiver about this at age 50, when routine screening begins. Direct examination of the colon should be repeated every 5 to 10 years through age 75, unless early forms of pre-cancerous polyps or small growths are found.  Hepatitis C blood testing is recommended for all people born  from 1945 through 1965 and any individual with known risks for hepatitis C.  Practice safe sex. Use condoms and avoid high-risk sexual practices to reduce the spread of sexually transmitted infections (STIs). Sexually active women aged 25 and younger should be checked for Chlamydia, which is a common sexually transmitted infection. Older women with new or multiple partners should also be tested for Chlamydia. Testing for other STIs is recommended if you are sexually active and at increased risk.  Osteoporosis is a disease in which the bones lose minerals and strength with aging. This can result in serious bone fractures. The risk of osteoporosis can be identified using a bone density scan. Women ages 65 and over and women at risk for fractures or osteoporosis should discuss screening with their caregivers. Ask your caregiver whether you should be taking a calcium supplement or vitamin D to reduce the rate of osteoporosis.  Menopause can be associated with physical symptoms and risks. Hormone replacement therapy is available to decrease symptoms and risks. You should talk to your caregiver about whether hormone replacement therapy is right for you.  Use sunscreen. Apply sunscreen liberally and repeatedly throughout the day. You should seek shade when your shadow is shorter than you. Protect yourself by wearing long sleeves, pants, a wide-brimmed hat, and sunglasses year round, whenever you are outdoors.  Notify your caregiver of new moles or changes in moles, especially if there is a change in shape or color. Also notify your caregiver if a mole is larger than the size of a pencil eraser.  Stay current with your immunizations. Document Released: 05/13/2011 Document Revised: 02/22/2013 Document Reviewed: 05/13/2011 [employer] Patient Information 2014 [employer].   

## 2014-05-21 LAB — URINALYSIS W MICROSCOPIC + REFLEX CULTURE
Bacteria, UA: NONE SEEN
Bilirubin Urine: NEGATIVE
Casts: NONE SEEN
Crystals: NONE SEEN
Glucose, UA: NEGATIVE mg/dL
Hgb urine dipstick: NEGATIVE
KETONES UR: NEGATIVE mg/dL
Leukocytes, UA: NEGATIVE
NITRITE: NEGATIVE
PH: 6 (ref 5.0–8.0)
Protein, ur: NEGATIVE mg/dL
SQUAMOUS EPITHELIAL / LPF: NONE SEEN
Specific Gravity, Urine: 1.014 (ref 1.005–1.030)
Urobilinogen, UA: 0.2 mg/dL (ref 0.0–1.0)

## 2014-06-01 ENCOUNTER — Ambulatory Visit (HOSPITAL_COMMUNITY): Payer: Self-pay | Admitting: Psychology

## 2014-06-20 ENCOUNTER — Ambulatory Visit (INDEPENDENT_AMBULATORY_CARE_PROVIDER_SITE_OTHER): Payer: No Typology Code available for payment source | Admitting: Psychology

## 2014-06-20 DIAGNOSIS — F064 Anxiety disorder due to known physiological condition: Secondary | ICD-10-CM

## 2014-06-20 DIAGNOSIS — F39 Unspecified mood [affective] disorder: Secondary | ICD-10-CM

## 2014-06-22 ENCOUNTER — Encounter (HOSPITAL_COMMUNITY): Payer: Self-pay | Admitting: Psychology
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
## 2014-07-25 ENCOUNTER — Ambulatory Visit (INDEPENDENT_AMBULATORY_CARE_PROVIDER_SITE_OTHER): Payer: No Typology Code available for payment source | Admitting: Psychology

## 2014-07-25 DIAGNOSIS — F064 Anxiety disorder due to known physiological condition: Secondary | ICD-10-CM

## 2014-07-25 DIAGNOSIS — F39 Unspecified mood [affective] disorder: Secondary | ICD-10-CM

## 2014-08-25 ENCOUNTER — Encounter (HOSPITAL_COMMUNITY): Payer: Self-pay | Admitting: Psychology
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
## 2014-09-05 ENCOUNTER — Ambulatory Visit (INDEPENDENT_AMBULATORY_CARE_PROVIDER_SITE_OTHER): Payer: No Typology Code available for payment source | Admitting: Psychology

## 2014-09-05 DIAGNOSIS — F39 Unspecified mood [affective] disorder: Secondary | ICD-10-CM

## 2014-09-05 DIAGNOSIS — F064 Anxiety disorder due to known physiological condition: Secondary | ICD-10-CM

## 2014-09-12 ENCOUNTER — Encounter (HOSPITAL_COMMUNITY): Payer: Self-pay | Admitting: Psychology

## 2014-09-15 ENCOUNTER — Other Ambulatory Visit: Payer: Self-pay

## 2014-09-15 DIAGNOSIS — Z1231 Encounter for screening mammogram for malignant neoplasm of breast: Secondary | ICD-10-CM

## 2014-10-04 ENCOUNTER — Ambulatory Visit: Payer: Self-pay

## 2014-10-11 ENCOUNTER — Ambulatory Visit (INDEPENDENT_AMBULATORY_CARE_PROVIDER_SITE_OTHER): Payer: No Typology Code available for payment source | Admitting: Psychology

## 2014-10-11 DIAGNOSIS — F064 Anxiety disorder due to known physiological condition: Secondary | ICD-10-CM

## 2014-10-11 DIAGNOSIS — F39 Unspecified mood [affective] disorder: Secondary | ICD-10-CM

## 2014-10-19 ENCOUNTER — Encounter (HOSPITAL_COMMUNITY): Payer: Self-pay | Admitting: Psychology
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
## 2014-10-20 ENCOUNTER — Encounter (HOSPITAL_COMMUNITY): Payer: Self-pay | Admitting: Psychology
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
## 2014-10-24 ENCOUNTER — Ambulatory Visit: Payer: Self-pay

## 2014-11-22 ENCOUNTER — Ambulatory Visit
Admission: RE | Admit: 2014-11-22 | Discharge: 2014-11-22 | Disposition: A | Discharge status: Home / Self Care | Payer: Medicare Other | Source: Ambulatory Visit

## 2014-11-22 DIAGNOSIS — Z1231 Encounter for screening mammogram for malignant neoplasm of breast: Secondary | ICD-10-CM

## 2014-12-06 ENCOUNTER — Ambulatory Visit (INDEPENDENT_AMBULATORY_CARE_PROVIDER_SITE_OTHER): Payer: BLUE CROSS/BLUE SHIELD | Admitting: Psychology

## 2014-12-13 ENCOUNTER — Ambulatory Visit (HOSPITAL_COMMUNITY): Payer: Self-pay | Admitting: Psychology

## 2015-01-03 ENCOUNTER — Ambulatory Visit (INDEPENDENT_AMBULATORY_CARE_PROVIDER_SITE_OTHER): Payer: No Typology Code available for payment source | Admitting: Psychology

## 2015-01-03 DIAGNOSIS — F064 Anxiety disorder due to known physiological condition: Secondary | ICD-10-CM

## 2015-01-03 DIAGNOSIS — F39 Unspecified mood [affective] disorder: Secondary | ICD-10-CM

## 2015-01-04 ENCOUNTER — Encounter (HOSPITAL_COMMUNITY): Payer: Self-pay | Admitting: Psychology

## 2015-01-04 ENCOUNTER — Telehealth: Payer: Self-pay | Admitting: *Deleted

## 2015-01-04 DIAGNOSIS — K5909 Other constipation: Secondary | ICD-10-CM

## 2015-01-04 DIAGNOSIS — K649 Unspecified hemorrhoids: Secondary | ICD-10-CM

## 2015-01-04 NOTE — Telephone Encounter (Signed)
Pt informed with the below note, referral placed for [facility]. Pt will call to schedule,.

## 2015-01-04 NOTE — Telephone Encounter (Signed)
Pt called stating she is having some difficulty passing stool, has bowl movement every other day, was daily, had annual on 05/20/14. Pt said it was difficulty to explain. Would you like patient to schedule OV with you? Or refer to GI.? Please advise

## 2015-01-04 NOTE — Telephone Encounter (Signed)
Appointment with primary M.D. If patient has one or [facility]. I am not person she needs to see for this complaint.
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
## 2015-01-05 NOTE — Telephone Encounter (Signed)
Appointment on 02/21/15 @ 1:45pm

## 2015-01-25 ENCOUNTER — Ambulatory Visit (INDEPENDENT_AMBULATORY_CARE_PROVIDER_SITE_OTHER): Payer: BLUE CROSS/BLUE SHIELD | Admitting: Psychology

## 2015-01-25 ENCOUNTER — Ambulatory Visit (HOSPITAL_COMMUNITY): Payer: Self-pay | Admitting: Psychology

## 2015-01-25 DIAGNOSIS — F064 Anxiety disorder due to known physiological condition: Secondary | ICD-10-CM | POA: Diagnosis not present

## 2015-01-25 DIAGNOSIS — F39 Unspecified mood [affective] disorder: Secondary | ICD-10-CM

## 2015-02-21 ENCOUNTER — Ambulatory Visit (INDEPENDENT_AMBULATORY_CARE_PROVIDER_SITE_OTHER): Payer: Medicare Other | Admitting: Internal Medicine

## 2015-02-21 ENCOUNTER — Encounter: Payer: Self-pay | Admitting: Internal Medicine

## 2015-02-21 VITALS — BP 110/70 | HR 78 | Ht 64.0 in | Wt 139.4 lb

## 2015-02-21 DIAGNOSIS — R109 Unspecified abdominal pain: Secondary | ICD-10-CM | POA: Diagnosis not present

## 2015-02-21 DIAGNOSIS — K5900 Constipation, unspecified: Secondary | ICD-10-CM | POA: Diagnosis not present

## 2015-02-21 DIAGNOSIS — R194 Change in bowel habit: Secondary | ICD-10-CM | POA: Diagnosis not present

## 2015-02-21 NOTE — Patient Instructions (Signed)
Please complete the Hemoccult cards and return as instructed.

## 2015-02-21 NOTE — Progress Notes (Signed)
HISTORY OF PRESENT ILLNESS:  Cristina Soto is a 44 y.o. female, [employer] member, who is sent today by her gynecologist Dr. Audie BoxFontaine regarding intermittent difficulty with bowel habits, change in bowel habits, and lower abdominal discomfort. Patient reports noticing difficulties with bowel evacuation approximately 3 years ago. There is the need to strain. She does not use laxatives. She is able to move her bowels daily but some days are more satisfactory than others. There is occasional suprapubic and left lower quadrant discomfort seemingly related to these difficulties. She has not had bleeding. Approximately 18 months ago she removed bread from her diet. This was associated with 15 pound weight loss and the sensation of less bloating and possibly enhanced bowel movements. She also mentions lower back pain. She questions possible hemorrhoids. Used a suppository once. No family history of colon cancer or inflammatory bowel disease. She does describe an abnormal protrusion with manual manipulation through the vagina. She did have this evaluated with pelvic ultrasound which was negative. Finally, she does notice problems with occasional postprandial regurgitation when lying at night. She has not had prior GI evaluation  REVIEW OF SYSTEMS:  All non-GI ROS negative except for anxiety, back pain, fatigue, headaches, menstrual pain, increased thirst  Past Medical History  Diagnosis Date  . Migraine   . Chronic fatigue fibromyalgia syndrome   . CIN I (cervical intraepithelial neoplasia I) 2002  . Hemorrhoids   . Vulvar pruritus   . Anxiety   . Asthma   . Depression   . Hepatitis B carrier     Past Surgical History  Procedure Laterality Date  . Dilation and curettage of uterus  1997    TH.AB  . Colposcopy  2002    Social History Cristina Soto  reports that she has never smoked. She has never used smokeless tobacco. She reports that she does not drink alcohol or use illicit  drugs.  family history includes Cancer in her paternal grandfather and paternal grandmother; Diverticulitis in her father and sister. There is no history of Colon cancer.  Allergies  Allergen Reactions  . Erythromycin Nausea And Vomiting  . Promethazine Hcl Nausea And Vomiting    Phenergan       PHYSICAL EXAMINATION: Vital signs: BP 110/70 mmHg  Pulse 78  Ht 5\' 4"  (1.626 m)  Wt 139 lb 6.4 oz (63.231 kg)  BMI 23.92 kg/m2  Constitutional: generally well-appearing, no acute distress Psychiatric: alert and oriented x3, cooperative Eyes: extraocular movements intact, anicteric, conjunctiva pink Mouth: oral pharynx moist, no lesions Neck: supple no lymphadenopathy Cardiovascular: heart regular rate and rhythm, no murmur Lungs: clear to auscultation bilaterally Abdomen: soft, nontender, nondistended, no obvious ascites, no peritoneal signs, normal bowel sounds, no organomegaly Rectal: Omitted Extremities: no lower extremity edema bilaterally Skin: no lesions on visible extremities Neuro: No focal deficits.   ASSESSMENT:  #1. 3 year history of difficulty initiating defecation, change in bowel habits and stool caliber, and intermittent abdominal discomfort. Rule out structural abnormality. Not likely related to hemorrhoids.   PLAN:  #1. Discussed colonoscopy to rule out significant anatomic abnormalities. The nature of the procedure, as well as the risks, benefits, and alternatives were carefully and thoroughly reviewed with the patient. Ample time for discussion and questions allowed. The patient understood, was satisfied, and agreed to proceed.She wishes to think about this and will let us know if she decides. #2. Hemoccult cards #3. Increase dietary fiber  A copy of this consultation and has been sent to Dr.  Fontaine

## 2015-03-22 ENCOUNTER — Other Ambulatory Visit (INDEPENDENT_AMBULATORY_CARE_PROVIDER_SITE_OTHER): Payer: BLUE CROSS/BLUE SHIELD

## 2015-03-22 DIAGNOSIS — R194 Change in bowel habit: Secondary | ICD-10-CM | POA: Diagnosis not present

## 2015-03-22 DIAGNOSIS — R109 Unspecified abdominal pain: Secondary | ICD-10-CM

## 2015-03-22 DIAGNOSIS — K5900 Constipation, unspecified: Secondary | ICD-10-CM

## 2015-03-22 LAB — HEMOCCULT SLIDES (X 3 CARDS)
Fecal Occult Blood: NEGATIVE
OCCULT 1: NEGATIVE
OCCULT 2: NEGATIVE
OCCULT 3: NEGATIVE
OCCULT 4: NEGATIVE
OCCULT 5: NEGATIVE

## 2015-04-24 ENCOUNTER — Encounter (HOSPITAL_COMMUNITY): Payer: Self-pay | Admitting: Psychology
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
## 2015-05-22 ENCOUNTER — Telehealth: Payer: Self-pay | Admitting: *Deleted

## 2015-05-22 ENCOUNTER — Encounter: Payer: Self-pay | Admitting: Gynecology

## 2015-05-22 MED ORDER — CLOBETASOL PROPIONATE 0.05 % EX CREA: 1.0000 "application " | TOPICAL_CREAM | Freq: Two times a day (BID) | CUTANEOUS | Status: DC

## 2015-05-22 MED ORDER — BETAMETHASONE DIPROPIONATE AUG 0.05 % EX CREA: TOPICAL_CREAM | Freq: Two times a day (BID) | CUTANEOUS | Status: DC

## 2015-05-22 NOTE — Telephone Encounter (Signed)
Rx sent 

## 2015-05-22 NOTE — Telephone Encounter (Signed)
Pharmacy said clobetasol cream 0.05 % cream is covered. If you are fine with Rx will be sent.

## 2015-05-22 NOTE — Telephone Encounter (Signed)
Pt has been using mycology ointment for Mild vulvitis. Pt said the ointment make vulvitis worse with dryness,she asked is a lotion form that she could use for this area? Please advise

## 2015-05-22 NOTE — Telephone Encounter (Signed)
Pharmacy called and said Diprolene 0.05% is not on her formulary list and is not covered.. I asked for options that are on formulary, pharmacist said it doesn't show any.

## 2015-05-22 NOTE — Telephone Encounter (Signed)
Okay with Temovate 0.05% cream 30 g apply as needed, no refill

## 2015-05-22 NOTE — Telephone Encounter (Signed)
They have to have some option for a moderate strength steroid cream.

## 2015-05-22 NOTE — Telephone Encounter (Signed)
I would suggest Diprolene 0.05% cream 30 g apply as needed. Has appointment to see me in one month. Will assess at that point if ongoing.

## 2015-05-22 NOTE — Telephone Encounter (Signed)
Pt aware of the below, Rx sent. 

## 2015-05-22 NOTE — Addendum Note (Signed)
Addended by: Aura CampsWEBB, Lindsey Hommel L on: 05/22/2015 12:21 PM   Modules accepted: Orders, Medications

## 2015-05-22 NOTE — Addendum Note (Signed)
Addended by: Aura CampsWEBB, JENNIFER L on: 05/22/2015 04:54 PM   Modules accepted: Orders

## 2015-05-24 ENCOUNTER — Ambulatory Visit (INDEPENDENT_AMBULATORY_CARE_PROVIDER_SITE_OTHER): Payer: No Typology Code available for payment source | Admitting: Psychology

## 2015-05-24 DIAGNOSIS — F064 Anxiety disorder due to known physiological condition: Secondary | ICD-10-CM | POA: Diagnosis not present

## 2015-05-24 DIAGNOSIS — F39 Unspecified mood [affective] disorder: Secondary | ICD-10-CM | POA: Diagnosis not present
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
## 2015-06-21 ENCOUNTER — Ambulatory Visit (INDEPENDENT_AMBULATORY_CARE_PROVIDER_SITE_OTHER): Payer: No Typology Code available for payment source | Admitting: Psychology

## 2015-06-21 DIAGNOSIS — F064 Anxiety disorder due to known physiological condition: Secondary | ICD-10-CM

## 2015-06-21 DIAGNOSIS — F39 Unspecified mood [affective] disorder: Secondary | ICD-10-CM | POA: Diagnosis not present

## 2015-06-27 ENCOUNTER — Ambulatory Visit (INDEPENDENT_AMBULATORY_CARE_PROVIDER_SITE_OTHER): Payer: Medicare Other | Admitting: Gynecology

## 2015-06-27 ENCOUNTER — Encounter: Payer: Self-pay | Admitting: Gynecology

## 2015-06-27 VITALS — BP 120/76 | Ht 64.0 in | Wt 136.0 lb

## 2015-06-27 DIAGNOSIS — Z01419 Encounter for gynecological examination (general) (routine) without abnormal findings: Secondary | ICD-10-CM

## 2015-06-27 NOTE — Progress Notes (Signed)
KADIENCE MACCHI [birth year] [id number]        44 y.o.  G1P0010 for annual exam.  Doing well. Several issues noted below.  Past medical history,surgical history, problem list, medications, allergies, family history and social history were all reviewed and documented as reviewed in the EPIC chart.  ROS:  Performed with pertinent positives and negatives included in the history, assessment and plan.   Additional significant findings :  none   Exam: [facility] person Vitals:   06/27/15 1500  BP: 120/76  Height:  (1.626 m)  Weight: 136 lb (61.689 kg)   General appearance:  Normal affect, orientation and appearance. Skin: Grossly normal HEENT: Without gross lesions.  No cervical or supraclavicular adenopathy. Thyroid normal.  Lungs:  Clear without wheezing, rales or rhonchi Cardiac: RR, without RMG Abdominal:  Soft, nontender, without masses, guarding, rebound, organomegaly or hernia Breasts:  Examined lying and sitting without masses, retractions, discharge or axillary adenopathy. Pelvic:  Ext/BUS/vagina normal  Cervix normal  Uterus retroverted, normal size, shape and contour, midline and mobile nontender   Adnexa  Without masses or tenderness    Anus and perineum  Normal   Rectovaginal  Normal sphincter tone without palpated masses or tenderness.    Assessment/Plan:  44 y.o. G24P0010 female for annual exam with regular menses, abstinent birth control.   1. Abstinent birth control. Currently not sexually active. She did ask about if she did meet somebody the issues of pregnancy. She would want to try for pregnancy. I reviewed the issues of biological clock, increased risk of chromosomal abnormalities with advancing maternal age and increased risks of metabolic issues such as diabetes and hypertension. Availability of prenatal diagnostic options also discussed in general. Not an issue now and she'll revisit this if it becomes an issue. 2. Mammography 11/2014. Continue with annual  mammography. SBE monthly reviewed. 3. Pap smear 2014. No Pap smear done today.  Plan repeat Pap smear next year at three-year interval.  History of CIN-1 2002 with no treatment and negative Pap smears since then. 4. Patient has listed hepatitis B carrier. On questioning she was screened years ago and told that she was immune. Not that she was a carrier with chronic active hepatitis. We'll check hepatitis B surface antibody now for completeness. 5. Health maintenance. Baseline CBC, comprehensive metabolic panel, lipid profile, urinalysis ordered. Follow up in one year, sooner as needed.   Dara Lords MD, 3:32 PM 06/27/2015

## 2015-06-27 NOTE — Patient Instructions (Signed)

## 2015-06-28 ENCOUNTER — Telehealth: Payer: Self-pay | Admitting: Gynecology

## 2015-06-28 DIAGNOSIS — Z1159 Encounter for screening for other viral diseases: Secondary | ICD-10-CM

## 2015-06-28 LAB — URINALYSIS W MICROSCOPIC + REFLEX CULTURE
BACTERIA UA: NONE SEEN [HPF]
Bilirubin Urine: NEGATIVE
CASTS: NONE SEEN [LPF]
Crystals: NONE SEEN [HPF]
Glucose, UA: NEGATIVE
HGB URINE DIPSTICK: NEGATIVE
KETONES UR: NEGATIVE
Leukocytes, UA: NEGATIVE
Nitrite: NEGATIVE
PROTEIN: NEGATIVE
RBC / HPF: NONE SEEN RBC/HPF (ref <=2)
SQUAMOUS EPITHELIAL / LPF: NONE SEEN [HPF] (ref <=5)
Specific Gravity, Urine: 1.006 (ref 1.001–1.035)
WBC, UA: NONE SEEN WBC/HPF (ref <=5)
Yeast: NONE SEEN [HPF]
pH: 7 (ref 5.0–8.0)

## 2015-06-28 LAB — CBC WITH DIFFERENTIAL/PLATELET
BASOS PCT: 1 % (ref 0–1)
Basophils Absolute: 0.1 10*3/uL (ref 0.0–0.1)
Eosinophils Absolute: 0.2 10*3/uL (ref 0.0–0.7)
Eosinophils Relative: 4 % (ref 0–5)
HCT: 36.9 % (ref 36.0–46.0)
HEMOGLOBIN: 11.8 g/dL — AB (ref 12.0–15.0)
Lymphocytes Relative: 25 % (ref 12–46)
Lymphs Abs: 1.5 10*3/uL (ref 0.7–4.0)
MCH: 26.9 pg (ref 26.0–34.0)
MCHC: 32 g/dL (ref 30.0–36.0)
MCV: 84.2 fL (ref 78.0–100.0)
MPV: 10.2 fL (ref 8.6–12.4)
Monocytes Absolute: 0.6 10*3/uL (ref 0.1–1.0)
Monocytes Relative: 10 % (ref 3–12)
NEUTROS ABS: 3.5 10*3/uL (ref 1.7–7.7)
NEUTROS PCT: 60 % (ref 43–77)
Platelets: 279 10*3/uL (ref 150–400)
RBC: 4.38 MIL/uL (ref 3.87–5.11)
RDW: 13.8 % (ref 11.5–15.5)
WBC: 5.9 10*3/uL (ref 4.0–10.5)

## 2015-06-28 LAB — COMPREHENSIVE METABOLIC PANEL
ALT: 9 U/L (ref 6–29)
AST: 15 U/L (ref 10–35)
Albumin: 3.9 g/dL (ref 3.6–5.1)
Alkaline Phosphatase: 50 U/L (ref 33–115)
BUN: 7 mg/dL (ref 7–25)
CHLORIDE: 103 mmol/L (ref 98–110)
CO2: 23 mmol/L (ref 20–31)
Calcium: 9.1 mg/dL (ref 8.6–10.2)
Creat: 0.61 mg/dL (ref 0.50–1.10)
GLUCOSE: 87 mg/dL (ref 65–99)
Potassium: 4 mmol/L (ref 3.5–5.3)
SODIUM: 138 mmol/L (ref 135–146)
TOTAL PROTEIN: 6.8 g/dL (ref 6.1–8.1)
Total Bilirubin: 0.5 mg/dL (ref 0.2–1.2)

## 2015-06-28 LAB — LIPID PANEL
CHOL/HDL RATIO: 3.5 ratio (ref <=5.0)
CHOLESTEROL: 131 mg/dL (ref 125–200)
HDL: 37 mg/dL — ABNORMAL LOW (ref >=46)
LDL Cholesterol: 85 mg/dL (ref <130)
Triglycerides: 45 mg/dL (ref <150)
VLDL: 9 mg/dL (ref <30)

## 2015-06-28 LAB — HEPATITIS B SURFACE ANTIBODY, QUANTITATIVE: HEPATITIS B-POST: 0 m[IU]/mL

## 2015-06-28 NOTE — Telephone Encounter (Signed)
Tell patient that her lab work and back normal excepting her hepatitis B antibody was negative which means one of 2 things. She is either a chronic carrier of hepatitis or she was never exposed to hepatitis. Recommend checking a hepatitis B surface antigen blood test. I put the order in already as a future order.

## 2015-07-03 NOTE — Telephone Encounter (Signed)
Pt aware she will come tomorrow at 2:10pm

## 2015-07-04 ENCOUNTER — Other Ambulatory Visit: Payer: Medicare Other

## 2015-07-04 DIAGNOSIS — Z1159 Encounter for screening for other viral diseases: Secondary | ICD-10-CM

## 2015-07-05 LAB — HEPATITIS B SURFACE ANTIGEN: Hepatitis B Surface Ag: NEGATIVE

## 2015-08-08 ENCOUNTER — Encounter (HOSPITAL_COMMUNITY): Payer: Self-pay | Admitting: Psychology
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
## 2015-10-11 ENCOUNTER — Ambulatory Visit (INDEPENDENT_AMBULATORY_CARE_PROVIDER_SITE_OTHER): Payer: No Typology Code available for payment source | Admitting: Psychology

## 2015-10-11 ENCOUNTER — Encounter (HOSPITAL_COMMUNITY): Payer: Self-pay | Admitting: Psychology

## 2015-10-11 DIAGNOSIS — F39 Unspecified mood [affective] disorder: Secondary | ICD-10-CM | POA: Diagnosis not present

## 2015-10-11 DIAGNOSIS — F064 Anxiety disorder due to known physiological condition: Secondary | ICD-10-CM
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
## 2015-12-11 ENCOUNTER — Ambulatory Visit (INDEPENDENT_AMBULATORY_CARE_PROVIDER_SITE_OTHER): Payer: No Typology Code available for payment source | Admitting: Psychology

## 2015-12-11 DIAGNOSIS — F064 Anxiety disorder due to known physiological condition: Secondary | ICD-10-CM | POA: Diagnosis not present

## 2015-12-11 DIAGNOSIS — F39 Unspecified mood [affective] disorder: Secondary | ICD-10-CM

## 2015-12-12 ENCOUNTER — Telehealth (HOSPITAL_COMMUNITY): Payer: Self-pay | Admitting: *Deleted

## 2015-12-12 NOTE — Telephone Encounter (Signed)
phone call from patient, she would like a phone call from you please.

## 2016-01-09 ENCOUNTER — Ambulatory Visit (INDEPENDENT_AMBULATORY_CARE_PROVIDER_SITE_OTHER): Payer: Medicare Other | Admitting: Psychology

## 2016-01-09 DIAGNOSIS — F064 Anxiety disorder due to known physiological condition: Secondary | ICD-10-CM | POA: Diagnosis not present

## 2016-01-09 DIAGNOSIS — F39 Unspecified mood [affective] disorder: Secondary | ICD-10-CM

## 2016-02-27 ENCOUNTER — Other Ambulatory Visit: Payer: Self-pay

## 2016-02-27 ENCOUNTER — Encounter (HOSPITAL_COMMUNITY): Payer: Self-pay | Admitting: Psychology

## 2016-02-27 DIAGNOSIS — Z1231 Encounter for screening mammogram for malignant neoplasm of breast: Secondary | ICD-10-CM
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
## 2016-03-11 ENCOUNTER — Ambulatory Visit
Admission: RE | Admit: 2016-03-11 | Discharge: 2016-03-11 | Disposition: A | Discharge status: Home / Self Care | Payer: Medicare Other | Source: Ambulatory Visit

## 2016-03-11 DIAGNOSIS — Z1231 Encounter for screening mammogram for malignant neoplasm of breast: Secondary | ICD-10-CM

## 2016-04-17 ENCOUNTER — Encounter (HOSPITAL_COMMUNITY): Payer: Self-pay | Admitting: Psychology
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
## 2016-04-22 ENCOUNTER — Telehealth: Payer: Self-pay | Admitting: *Deleted

## 2016-04-22 MED ORDER — PERMETHRIN 5 % EX CREA: TOPICAL_CREAM | CUTANEOUS | Status: DC

## 2016-04-22 NOTE — Telephone Encounter (Signed)
Try permethrin 5% cream from head to toe leave on 8-14 hours then rinse

## 2016-04-22 NOTE — Telephone Encounter (Signed)
Pt aware, Rx sent. 

## 2016-04-22 NOTE — Telephone Encounter (Signed)
Pt called requesting your advice pt said 2 [facility] and was [facility],was given Rx for malathion cream to use daily x 7 days. Pt said she is shower twice a day, washes her clothes, cleaning her apartment daily. But she doesn't appear to be getting better, states the lice now appears on in lips, hair and at her pubic hair line, only sees the egg, not the insert yet. Pt confused because she doesn't know if she has pubic lice or if actually body lice. She doesn't have PCP, I told pt to [facility]. Pt would like to know you thoughts? Please advise

## 2016-05-02 DIAGNOSIS — H6123 Impacted cerumen, bilateral: Secondary | ICD-10-CM | POA: Insufficient documentation

## 2016-05-02 DIAGNOSIS — H9203 Otalgia, bilateral: Secondary | ICD-10-CM | POA: Insufficient documentation

## 2016-05-16 ENCOUNTER — Telehealth: Payer: Self-pay

## 2016-05-16 NOTE — Telephone Encounter (Signed)
Already had encounter opened. 

## 2016-05-16 NOTE — Telephone Encounter (Signed)
I left message with Dr. Kristie CowmanF's recommendation on her voice mail [facility].

## 2016-05-16 NOTE — Telephone Encounter (Addendum)
Patient had called here back on 04/22/16 regarding body lice and you prescribed permethrin 5% cream. Patient called today and states the problem has continued since then. Dermatologist prescribed for face as it was in her eyelashes and on her face.  She said she is still itching terribly in her "crotch area" and when she scratches "these little tiny long things" come off. She did say " I don't know if I have lice, mites, worms or fungus". She said she is miserable with the vulvar itching and would like you to prescribe/recommend something she can apply directly there topically.  [place] will need to change her pharmacy before we send any Rx in I will speak back with her and get pharmacy info.

## 2016-05-16 NOTE — Telephone Encounter (Signed)
She needs to be seen by her primary physician or an urgent care. This is now out of the realm of gynecologic

## 2016-05-22 ENCOUNTER — Ambulatory Visit (INDEPENDENT_AMBULATORY_CARE_PROVIDER_SITE_OTHER): Payer: No Typology Code available for payment source | Admitting: Psychology

## 2016-05-22 DIAGNOSIS — F39 Unspecified mood [affective] disorder: Secondary | ICD-10-CM | POA: Diagnosis not present

## 2016-05-22 DIAGNOSIS — F064 Anxiety disorder due to known physiological condition: Secondary | ICD-10-CM

## 2016-05-24 ENCOUNTER — Encounter (HOSPITAL_COMMUNITY): Payer: Self-pay | Admitting: Psychology
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
## 2016-06-05 ENCOUNTER — Ambulatory Visit (HOSPITAL_COMMUNITY): Payer: Self-pay | Admitting: Psychology

## 2016-07-01 ENCOUNTER — Encounter: Payer: Medicare Other | Admitting: Gynecology

## 2016-07-08 ENCOUNTER — Encounter: Payer: Medicare Other | Admitting: Gynecology

## 2016-08-01 ENCOUNTER — Ambulatory Visit (INDEPENDENT_AMBULATORY_CARE_PROVIDER_SITE_OTHER): Payer: No Typology Code available for payment source | Admitting: Psychology

## 2016-08-07 ENCOUNTER — Ambulatory Visit (INDEPENDENT_AMBULATORY_CARE_PROVIDER_SITE_OTHER): Payer: No Typology Code available for payment source | Admitting: Psychology

## 2016-08-07 DIAGNOSIS — F39 Unspecified mood [affective] disorder: Secondary | ICD-10-CM

## 2016-08-07 DIAGNOSIS — F064 Anxiety disorder due to known physiological condition: Secondary | ICD-10-CM

## 2016-08-08 ENCOUNTER — Encounter (HOSPITAL_COMMUNITY): Payer: Self-pay | Admitting: Psychology
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
## 2016-08-09 ENCOUNTER — Encounter: Payer: Self-pay | Admitting: Gynecology

## 2016-08-09 ENCOUNTER — Ambulatory Visit (INDEPENDENT_AMBULATORY_CARE_PROVIDER_SITE_OTHER): Payer: Medicare Other | Admitting: Gynecology

## 2016-08-09 VITALS — BP 120/76 | Ht 64.0 in | Wt 137.0 lb

## 2016-08-09 DIAGNOSIS — Z01419 Encounter for gynecological examination (general) (routine) without abnormal findings: Secondary | ICD-10-CM | POA: Diagnosis not present

## 2016-08-09 NOTE — Patient Instructions (Signed)

## 2016-08-09 NOTE — Addendum Note (Signed)
Addended by: Dayna BarkerGARDNER, KIMBERLY K on: 08/09/2016 12:43 PM   Modules accepted: Orders

## 2016-08-09 NOTE — Progress Notes (Signed)
    Cristina Soto [birth year] [id number]        45 y.o.  G1P0010  for annual exam.  Doing well without complaints  Past medical history,surgical history, problem list, medications, allergies, family history and social history were all reviewed and documented as reviewed in the EPIC chart.  ROS:  Performed with pertinent positives and negatives included in the history, assessment and plan.   Additional significant findings :  None   Exam: Kennon PortelaKim Gardner assistant Vitals:   08/09/16 1206  BP: 120/76  Weight: 137 lb (62.1 kg)  Height: 5\' 4"  (1.626 m)   Body mass index is 23.52 kg/m.  General appearance:  Normal affect, orientation and appearance. Skin: Grossly normal HEENT: Without gross lesions.  No cervical or supraclavicular adenopathy. Thyroid normal.  Lungs:  Clear without wheezing, rales or rhonchi Cardiac: RR, without RMG Abdominal:  Soft, nontender, without masses, guarding, rebound, organomegaly or hernia Breasts:  Examined lying and sitting without masses, retractions, discharge or axillary adenopathy. Pelvic:  Ext, BUS, Vagina normal  Cervix normal  Uterus retroverted, normal size, shape and contour, midline and mobile nontender   Adnexa without masses or tenderness    Anus and perineum normal   Rectovaginal normal sphincter tone without palpated masses or tenderness.    Assessment/Plan:  45 y.o. 131P0010 female for annual exam with regular menses, abstinent birth control.   1. Mammography 03/2016. Continue with annual mammography when due. SBE monthly reviewed. 2. Pap smear 2014. Pap smear/HPV today. History of CIN-1 2002 with no treatment and negative Pap smears since. 3. Health maintenance. Patient declines blood work. States that she is going to establish care with a primary in follow up with them for this. Follow up in one year, sooner as needed.   Dara LordsFONTAINE,Tanajah Boulter P MD, 12:31 PM 08/09/2016

## 2016-08-13 LAB — PAP IG AND HPV HIGH-RISK: HPV DNA HIGH RISK: NOT DETECTED

## 2016-08-15 ENCOUNTER — Ambulatory Visit (HOSPITAL_COMMUNITY): Payer: Self-pay | Admitting: Psychology

## 2016-09-19 ENCOUNTER — Encounter: Payer: Self-pay | Admitting: Pediatrics

## 2016-09-19 ENCOUNTER — Ambulatory Visit (INDEPENDENT_AMBULATORY_CARE_PROVIDER_SITE_OTHER): Payer: Medicare Other | Admitting: Pediatrics

## 2016-09-19 VITALS — BP 113/52 | HR 92 | Temp 97.9°F | Ht 64.0 in | Wt 141.0 lb

## 2016-09-19 DIAGNOSIS — Z0000 Encounter for general adult medical examination without abnormal findings: Secondary | ICD-10-CM

## 2016-09-19 NOTE — Progress Notes (Signed)
Subjective:   Patient ID: Sol PasserGeorgianna R Hochberg, female    DOB: 02-04-1971, 45 y.o.   MRN: [id number] CC: New Patient (Initial Visit) and CPE HPI: Sol PasserGeorgianna R Crisostomo is a 45 y.o. female presenting for New Patient (Initial Visit)  Has had a bad cold for the past ten days Feeling better Using flonase, netipot   Migraines, stress, anxiety, fibromyalgia, all worse around time of menstrual cycle Not on any birth control now Has been sexuallya ctive in the psat, is not now Pap smear--UTD, h/o abnormal, most recent 1 mo ago normal  Constipation: Takes yogurt for constipation Sometimes takes 2 or more days between stools No blood in stools  Grandparents with lung cancer MGF and PGF with liver cancer, pt thinks they were alcohol No breast ca, no colon ca  [facility]  Decreased Interest 0  Down, Depressed, Hopeless 0  PHQ - 2 Score 0     Past Medical History:  Diagnosis Date  . Anxiety   . Asthma   . Chronic fatigue fibromyalgia syndrome   . CIN I (cervical intraepithelial neoplasia I) 2002  . Depression   . Fibromyalgia   . Hemorrhoids   . Migraine   . Vulvar pruritus    Family History  Problem Relation Age of Onset  . Cancer Paternal Grandmother     Lung  . Cancer Paternal Grandfather     Liver  . Diverticulitis Father   . Diverticulitis Sister   . Colon cancer Neg Hx    Social History   Social History  . Marital status: Single    Spouse name: N/A  . Number of children: 0  . Years of education: N/A   Occupational History  . wine sales    Social History Main Topics  . Smoking status: Never Smoker  . Smokeless tobacco: Never Used  . Alcohol use No  . Drug use: No  . Sexual activity: No     Comment: 1st intercourse 45 yo-More than 5 partners   Other Topics Concern  . None   Social History Narrative  . None   ROS: All systems negative other than what is in HPI  Objective:    BP (!) 113/52   Pulse 92   Temp 97.9 F (36.6 C)  (Oral)   Ht 5\' 4"  (1.626 m)   Wt 141 lb (64 kg)   BMI 24.20 kg/m   Wt Readings from Last 3 Encounters:  09/19/16 141 lb (64 kg)  08/09/16 137 lb (62.1 kg)  06/27/15 136 lb (61.7 kg)    Gen: NAD, alert, cooperative with exam, NCAT EYES: EOMI, no conjunctival injection, or no icterus ENT:  TMs pearly gray b/l, OP without erythema LYMPH: no cervical LAD CV: NRRR, normal S1/S2, no murmur, distal pulses 2+ b/l Resp: CTABL, no wheezes, normal WOB Abd: +BS, soft, NTND. no guarding or organomegaly Ext: No edema, warm Neuro: Alert and oriented, strength equal b/l UE and LE, coordination grossly normal MSK: normal muscle bulk  Assessment & Plan:  Jabier MuttonGeorgianna was seen today for new patient (initial visit).  Diagnoses and all orders for this visit:  Encounter for preventive health examination Doing well overall Migraines, anxiety controlled with regular counseling sessions Pt has coping plan for increased stressed Recent lipid panel wnl, normal Cr Hg slightly low last check at 11.8, having regular periods Pt declines labs today. Encouraged increasing hydration for constipation, discussed OTC options if needed, limiting sugar intake Stay active as can help with fibromyalgia  Follow up plan: 1 yr Rex Krasarol Monserat Prestigiacomo, MD [facility]

## 2016-12-04 ENCOUNTER — Telehealth: Payer: Self-pay

## 2016-12-04 DIAGNOSIS — F439 Reaction to severe stress, unspecified: Secondary | ICD-10-CM

## 2016-12-04 NOTE — Telephone Encounter (Signed)
Pt needs a new referral placed to continue seeing Dr. Kieth Brightlyodenbough. He has changed locations and now is listed under Physical Med and Rehab. The Dx needs to be Fibromyalgia induced by stress.

## 2016-12-04 NOTE — Telephone Encounter (Signed)
Patient aware.

## 2016-12-04 NOTE — Telephone Encounter (Signed)
Order placed

## 2016-12-17 ENCOUNTER — Telehealth: Payer: Self-pay | Admitting: Pediatrics

## 2016-12-17 ENCOUNTER — Ambulatory Visit (INDEPENDENT_AMBULATORY_CARE_PROVIDER_SITE_OTHER): Payer: Medicare Other | Admitting: *Deleted

## 2016-12-17 DIAGNOSIS — Z23 Encounter for immunization: Secondary | ICD-10-CM | POA: Diagnosis not present

## 2016-12-17 NOTE — Telephone Encounter (Signed)
Patient called stating that she would like to come in and have a flu shot.  Patient will call back to make an appt for flu shot

## 2016-12-26 ENCOUNTER — Encounter: Payer: No Typology Code available for payment source | Attending: Psychology | Admitting: Psychology

## 2016-12-26 ENCOUNTER — Encounter: Payer: Self-pay | Admitting: Psychology

## 2016-12-26 DIAGNOSIS — F0630 Mood disorder due to known physiological condition, unspecified: Secondary | ICD-10-CM | POA: Diagnosis not present

## 2016-12-26 DIAGNOSIS — F064 Anxiety disorder due to known physiological condition: Secondary | ICD-10-CM

## 2016-12-26 DIAGNOSIS — F439 Reaction to severe stress, unspecified: Secondary | ICD-10-CM | POA: Diagnosis present
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
## 2017-02-27 ENCOUNTER — Encounter: Payer: Self-pay | Admitting: Psychology

## 2017-02-27 ENCOUNTER — Encounter: Payer: No Typology Code available for payment source | Attending: Psychology | Admitting: Psychology

## 2017-02-27 DIAGNOSIS — F39 Unspecified mood [affective] disorder: Secondary | ICD-10-CM | POA: Diagnosis not present

## 2017-02-27 DIAGNOSIS — F064 Anxiety disorder due to known physiological condition: Secondary | ICD-10-CM

## 2017-02-27 DIAGNOSIS — F0630 Mood disorder due to known physiological condition, unspecified: Secondary | ICD-10-CM | POA: Diagnosis not present
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
## 2017-04-21 ENCOUNTER — Telehealth: Payer: Self-pay | Admitting: Psychology

## 2017-04-21 NOTE — Telephone Encounter (Signed)
Patient has an urgent matter and would like for you to call her.

## 2017-04-28 ENCOUNTER — Telehealth: Payer: Self-pay | Admitting: Psychology

## 2017-04-28 ENCOUNTER — Telehealth: Payer: Self-pay | Admitting: *Deleted

## 2017-04-28 ENCOUNTER — Encounter: Payer: Self-pay | Admitting: Pediatrics

## 2017-04-28 ENCOUNTER — Ambulatory Visit (INDEPENDENT_AMBULATORY_CARE_PROVIDER_SITE_OTHER): Payer: Medicare Other | Admitting: Pediatrics

## 2017-04-28 VITALS — BP 102/67 | HR 95 | Temp 99.0°F | Ht 64.0 in | Wt 149.0 lb

## 2017-04-28 DIAGNOSIS — K5900 Constipation, unspecified: Secondary | ICD-10-CM | POA: Diagnosis not present

## 2017-04-28 DIAGNOSIS — G43809 Other migraine, not intractable, without status migrainosus: Secondary | ICD-10-CM

## 2017-04-28 DIAGNOSIS — F419 Anxiety disorder, unspecified: Secondary | ICD-10-CM | POA: Diagnosis not present

## 2017-04-28 NOTE — Patient Instructions (Signed)
Pro-biotic--try align  Miralax -- drink with lots of water Senna

## 2017-04-28 NOTE — Telephone Encounter (Signed)
Pt called patient asking me to document last 2 cycle has been light and pink discharge, stating migraines are coming back during cycles as well. Pt annual due in sept.

## 2017-04-28 NOTE — Progress Notes (Signed)
  Subjective:   Patient ID: Cristina Soto, female    DOB: May 28, 1971, 46 y.o.   MRN: [id number] CC: Migraine; Fibromyalgia; Anxiety; and Stress  HPI: Cristina Soto is a 46 y.o. female presenting for Migraine; Fibromyalgia; Anxiety; and Stress  When she has a regular schedule, stress, fibromyalgia is better controlled Chronic migraines came back about three months ago Denies depression, says she gets overwhelmed and then will get physical symptoms with stress Says counseling has helped her stress a lot  Past month has had lower back pain, inconsistent bowel movements Usually eats yogurt every day and that helps to keep her regular  Relevant past medical, surgical, family and social history reviewed. Allergies and medications reviewed and updated. History  Smoking Status  . Never Smoker  Smokeless Tobacco  . Never Used   ROS: Per HPI   Objective:    BP 102/67   Pulse 95   Temp 99 F (37.2 C) (Oral)   Ht 5\' 4"  (1.626 m)   Wt 149 lb (67.6 kg)   BMI 25.58 kg/m   Wt Readings from Last 3 Encounters:  04/28/17 149 lb (67.6 kg)  09/19/16 141 lb (64 kg)  08/09/16 137 lb (62.1 kg)    Gen: NAD, alert, cooperative with exam, NCAT EYES: EOMI, no conjunctival injection, or no icterus ENT:  TMs pearly gray b/l, OP without erythema LYMPH: no cervical LAD CV: NRRR, normal S1/S2, no murmur, distal pulses 2+ b/l Resp: CTABL, no wheezes, normal WOB Abd: +BS, soft, NTND. no guarding or organomegaly Ext: No edema, warm Neuro: Alert and oriented, strength equal b/l UE and LE, coordination grossly normal MSK: normal muscle bulk Psych: full affect, smiling often, denies SI  Assessment & Plan:  Cristina Soto was seen today for migraine, fibromyalgia, anxiety and stress.  Diagnoses and all orders for this visit:  Constipation, unspecified constipation type Discussed OTC meds Cont pro-biotic Increase fiber in diet someitmes with bloating, she isnt sure qhat foods Can try FODMAPs  diet for a few days to see if any improvement  Anxiety Discussed options, wants to avoid medications Will continue home stress relief techniques, cont therapy  Migraines Feel like prior migraines Thinks due to increased stress Doesn't want to start medication now Discussed abortive OTC meds no more than 2 times a week  Follow up plan: 3 mo Rex Krasarol Vincent, MD [facility]

## 2017-04-30 DIAGNOSIS — F419 Anxiety disorder, unspecified: Secondary | ICD-10-CM | POA: Insufficient documentation

## 2017-04-30 DIAGNOSIS — K5900 Constipation, unspecified: Secondary | ICD-10-CM | POA: Insufficient documentation

## 2017-04-30 DIAGNOSIS — G43909 Migraine, unspecified, not intractable, without status migrainosus: Secondary | ICD-10-CM | POA: Insufficient documentation

## 2017-05-20 ENCOUNTER — Encounter: Payer: No Typology Code available for payment source | Attending: Psychology | Admitting: Psychology

## 2017-05-20 ENCOUNTER — Encounter: Payer: Self-pay | Admitting: Psychology

## 2017-05-20 DIAGNOSIS — F0630 Mood disorder due to known physiological condition, unspecified: Secondary | ICD-10-CM

## 2017-05-20 DIAGNOSIS — F064 Anxiety disorder due to known physiological condition: Secondary | ICD-10-CM | POA: Diagnosis not present

## 2017-05-20 DIAGNOSIS — F39 Unspecified mood [affective] disorder: Secondary | ICD-10-CM | POA: Diagnosis not present
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
## 2017-05-22 ENCOUNTER — Encounter: Payer: Medicare Other | Admitting: Pediatrics

## 2017-05-23 ENCOUNTER — Encounter: Payer: Self-pay | Admitting: Pediatrics

## 2017-06-05 ENCOUNTER — Encounter: Payer: Self-pay | Admitting: Psychology
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
## 2017-06-11 ENCOUNTER — Encounter: Payer: Self-pay | Admitting: Pediatrics

## 2017-06-11 ENCOUNTER — Ambulatory Visit (INDEPENDENT_AMBULATORY_CARE_PROVIDER_SITE_OTHER): Payer: Medicare Other | Admitting: Pediatrics

## 2017-06-11 VITALS — BP 107/65 | HR 93 | Temp 99.0°F | Ht 64.0 in | Wt 148.0 lb

## 2017-06-11 DIAGNOSIS — Z0000 Encounter for general adult medical examination without abnormal findings: Secondary | ICD-10-CM

## 2017-06-11 DIAGNOSIS — Z1239 Encounter for other screening for malignant neoplasm of breast: Secondary | ICD-10-CM

## 2017-06-11 NOTE — Progress Notes (Signed)
  Subjective:   Patient ID: Cristina Soto, female    DOB: 1971/03/17, 46 y.o.   MRN: [id number] CC: Annual Exam  HPI: Cristina Soto is a 46 y.o. female presenting for Annual Exam  Constipation is getting better, is 80% better Following low FODMAPs diet helping some Yogurt has been helping a lot Had apprx 2-3 days of runny or constipation stools but always when she   Anxiety: still following with counselor Says stress is getting better since last visit  Migraines: better when stress is lower None for the past two weeks  Regular periods, slightly lighter the past 3 months  Walking regularly Strengthening core more  Has cut back on sodas, apprx 1.5 a day now Drinking more water  No fam h/o breast cancer or colon cancer  Relevant past medical, surgical, family and social history reviewed. Allergies and medications reviewed and updated. History  Smoking Status  . Never Smoker  Smokeless Tobacco  . Never Used   ROS: All systems neg other than what is in HPI  Objective:    BP 107/65   Pulse 93   Temp 99 F (37.2 C) (Oral)   Ht 5\' 4"  (1.626 m)   Wt 148 lb (67.1 kg)   BMI 25.40 kg/m   Wt Readings from Last 3 Encounters:  06/11/17 148 lb (67.1 kg)  04/28/17 149 lb (67.6 kg)  09/19/16 141 lb (64 kg)    Gen: NAD, alert, cooperative with exam, NCAT EYES: EOMI, no conjunctival injection, or no icterus ENT:  TMs pearly gray b/l, OP without erythema LYMPH: no cervical LAD CV: NRRR, normal S1/S2, no murmur, distal pulses 2+ b/l Resp: CTABL, no wheezes, normal WOB Abd: +BS, soft, NTND. no guarding or organomegaly Ext: No edema, warm Neuro: Alert and oriented, strength equal b/l UE and LE, coordination grossly normal MSK: normal muscle bulk Psych: normal affect, denies thoughts of self harm, mood improved  Assessment & Plan:  Cristina Soto was seen today for annual exam.  Diagnoses and all orders for this visit:  Encounter for preventive health examination Cont  reg exercise, yogurt for constipation, avoiding sugary foods  Breast cancer screening -     MM Digital Screening; Future   Follow up plan: Return in about 1 year (around 06/11/2018) for yearly physical. Rex Krasarol Vincent, MD [facility]

## 2017-06-16 ENCOUNTER — Encounter: Payer: No Typology Code available for payment source | Attending: Psychology | Admitting: Psychology

## 2017-06-16 DIAGNOSIS — F39 Unspecified mood [affective] disorder: Secondary | ICD-10-CM | POA: Diagnosis present

## 2017-06-16 DIAGNOSIS — F0630 Mood disorder due to known physiological condition, unspecified: Secondary | ICD-10-CM | POA: Diagnosis not present

## 2017-06-16 DIAGNOSIS — F064 Anxiety disorder due to known physiological condition: Secondary | ICD-10-CM

## 2017-06-18 ENCOUNTER — Ambulatory Visit (INDEPENDENT_AMBULATORY_CARE_PROVIDER_SITE_OTHER): Payer: Medicare Other | Admitting: *Deleted

## 2017-06-18 DIAGNOSIS — Z0000 Encounter for general adult medical examination without abnormal findings: Secondary | ICD-10-CM | POA: Diagnosis not present

## 2017-06-18 NOTE — Progress Notes (Signed)
Subjective:   Cristina Soto is a 46 y.o. female who presents for an Initial Medicare Annual Wellness Visit.  She states she is on Social Security Disability for anxiety/depression, and fibromyalgia.  She lives in [place], but spends a few nights per week at her parent's house in [place], [place]. She has a cat at her apartment in [place].  She is very close with her parents and 3 sisters who all live locally.  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 She states her health is the same in some ways, and better in some ways than it was last year.  She has not had any hospitalizations, ER visits, or surgeries in the past year.      Review of Systems    Patient states she has some soreness on the left side of her neck today.   All other systems negative.         Objective:    Today's Vitals   06/18/17 1044  BP: 112/71  Pulse: 89  Temp: 98.8 F (37.1 C)  Weight: 145 lb (65.8 kg)  Height: 5\' 4"  (1.626 Soto)  PainSc: 3   PainLoc: Neck   Body mass index is 24.89 kg/Soto.   Current Medications (verified) Outpatient Encounter Prescriptions as of 06/18/2017  Medication Sig  . triamcinolone (KENALOG) 0.1 % paste APPLY TO AFFECTED AREA BY PRESSING ON WITH A Q-TIP( DO NOT RUB). USE AT BEDTIME & AT MEALS UPTO 4X   No facility-administered encounter medications on file as of 06/18/2017.     Allergies (verified) Corticosteroids; Erythromycin; Promethazine hcl; and Promethazine hcl   History: Past Medical History:  Diagnosis Date  . Anxiety   . Asthma   . Chronic fatigue fibromyalgia syndrome   . CIN I (cervical intraepithelial neoplasia I) 2002  . Depression   . Fibromyalgia   . Hemorrhoids   . Migraine   . Vulvar pruritus    Past Surgical History:  Procedure Laterality Date  . COLPOSCOPY  2002  . DILATION AND CURETTAGE OF UTERUS  1997   TH.AB   Family  History  Problem Relation Age of Onset  . Cancer Paternal Grandmother        Lung  . Cancer Paternal Grandfather        Liver  . Diverticulitis Father   . Diverticulitis Sister   . Colon cancer Neg Hx    Occupation: works for [employer]     Activities of Daily Living In your present state of health, do you have any difficulty performing the following activities: 06/18/2017  Hearing? N  Vision? N  Difficulty concentrating or making decisions? N  Comment occassionally when feeling fatigued or migraine, but generally not   Walking or climbing stairs? N  Comment only when having fibromyalgia flare up  Dressing or bathing? N  Doing errands, shopping? N  Some recent data might be hidden    Immunizations and Health Maintenance Immunization History  Administered Date(s) Administered  . Influenza, Quadrivalent, Recombinant, Inj, Pf 12/17/2016   Health Maintenance Due  Topic Date Due  . HIV Screening  03/31/1985  . TETANUS/TDAP  03/31/1989  . INFLUENZA VACCINE  06/11/2017   Declined tdap today Recommend HIV screening at tdap at next visit with Dr. Oswaldo DoneVincent.  Patient Care Team: Johna SheriffVincent, Carol L, MD as PCP - General (Pediatrics)  Indicate any recent Medical Services you may  have received from other than [facility] providers in the past year (date may be approximate). [facility] opthalmology exam Dr. Mila Homer [facility]    Assessment:   This is a routine wellness examination for Cristina Soto.   Hearing/Vision screen No hearing deficits noted Up to date on eye exam, will be going for annual exam fall 2018.  Dietary issues and exercise activities discussed: Current Exercise Habits: Home exercise routine, Type of exercise: stretching, Time (Minutes): 30, Frequency (Times/Week): 2, Weekly Exercise (Minutes/Week): 60, Intensity: Mild, Exercise limited by: neurologic condition(s) (fibromyalgia flare ups deter patient from exercise)  Goals    . Exercise 3x per  week (30 min per time)          Increase cardiovascular exercise such as walking or stationary bike to 3-4 times per week for 30 minutes each session.       Depression Screen PHQ 2/9 Scores 06/18/2017 06/11/2017 04/28/2017 09/19/2016  PHQ - 2 Score 0 0 0 0    Fall Risk Fall Risk  06/18/2017 06/11/2017 04/28/2017 09/19/2016  Falls in the past year? No No No No    Cognitive Function: MMSE - Mini Mental State Exam 06/18/2017  Orientation to time 5  Orientation to Place 5  Registration 3  Attention/ Calculation 5  Recall 3  Language- name 2 objects 2  Language- repeat 1  Language- follow 3 step command 3  Language- read & follow direction 1  Write a sentence 1  Copy design 1  Total score 30        Screening Tests Health Maintenance  Topic Date Due  . HIV Screening  03/31/1985  . TETANUS/TDAP  03/31/1989  . INFLUENZA VACCINE  06/11/2017  . PAP SMEAR  08/10/2019      Plan:   Increase exercise to 3-4 times per week for 30 minutes each session Review information on Advanced Directives, and bring [facility] a copy if they are completed.   I have personally reviewed and noted the following in the patient's chart:   . Medical and social history . Use of alcohol, tobacco or illicit drugs  . Current medications and supplements . Functional ability and status . Nutritional status . Physical activity . Advanced directives . List of other physicians . Hospitalizations, surgeries, and ER visits in previous 12 months . Vitals . Screenings to include cognitive, depression, and falls . Referrals and appointments  In addition, I have reviewed and discussed with patient certain preventive protocols, quality metrics, and best practice recommendations. A written personalized care plan for preventive services as well as general preventive health recommendations were provided to patient.     Cristina Matusek M, RN   06/18/2017   I have reviewed and agree with the above AWV documentation.   Rex Kras, MD [facility]

## 2017-06-18 NOTE — Patient Instructions (Signed)
Please work on increasing your cardiovascular exercise to 3-4 times per week for 30 minutes   Please review Advanced Directives information, and if you put those in place please bring our office a copy so that we can file it on your chart  Thank you for coming in for your Annual Wellness Visit today!     Preventive Care 40-64 Years, Female Preventive care refers to lifestyle choices and visits with your health care provider that can promote health and wellness. What does preventive care include?  A yearly physical exam. This is also called an annual well check.  Dental exams once or twice a year.  Routine eye exams. Ask your health care provider how often you should have your eyes checked.  Personal lifestyle choices, including: ? Daily care of your teeth and gums. ? Regular physical activity. ? Eating a healthy diet. ? Avoiding tobacco and drug use. ? Limiting alcohol use. ? Practicing safe sex. ? Taking low-dose aspirin daily starting at age 68. ? Taking vitamin and mineral supplements as recommended by your health care provider. What happens during an annual well check? The services and screenings done by your health care provider during your annual well check will depend on your age, overall health, lifestyle risk factors, and family history of disease. Counseling Your health care provider may ask you questions about your:  Alcohol use.  Tobacco use.  Drug use.  Emotional well-being.  Home and relationship well-being.  Sexual activity.  Eating habits.  Work and work Statistician.  Method of birth control.  Menstrual cycle.  Pregnancy history.  Screening You may have the following tests or measurements:  Height, weight, and BMI.  Blood pressure.  Lipid and cholesterol levels. These may be checked every 5 years, or more frequently if you are over 17 years old.  Skin check.  Lung cancer screening. You may have this screening every year starting at  age 50 if you have a 30-pack-year history of smoking and currently smoke or have quit within the past 15 years.  Fecal occult blood test (FOBT) of the stool. You may have this test every year starting at age 71.  Flexible sigmoidoscopy or colonoscopy. You may have a sigmoidoscopy every 5 years or a colonoscopy every 10 years starting at age 63.  Hepatitis C blood test.  Hepatitis B blood test.  Sexually transmitted disease (STD) testing.  Diabetes screening. This is done by checking your blood sugar (glucose) after you have not eaten for a while (fasting). You may have this done every 1-3 years.  Mammogram. This may be done every 1-2 years. Talk to your health care provider about when you should start having regular mammograms. This may depend on whether you have a family history of breast cancer.  BRCA-related cancer screening. This may be done if you have a family history of breast, ovarian, tubal, or peritoneal cancers.  Pelvic exam and Pap test. This may be done every 3 years starting at age 9. Starting at age 26, this may be done every 5 years if you have a Pap test in combination with an HPV test.  Bone density scan. This is done to screen for osteoporosis. You may have this scan if you are at high risk for osteoporosis.  Discuss your test results, treatment options, and if necessary, the need for more tests with your health care provider. Vaccines Your health care provider may recommend certain vaccines, such as:  Influenza vaccine. This is recommended every year.  Tetanus,  diphtheria, and acellular pertussis (Tdap, Td) vaccine. You may need a Td booster every 10 years.  Varicella vaccine. You may need this if you have not been vaccinated.  Zoster vaccine. You may need this after age 19.  Measles, mumps, and rubella (MMR) vaccine. You may need at least one dose of MMR if you were born in 1957 or later. You may also need a second dose.  Pneumococcal 13-valent conjugate  (PCV13) vaccine. You may need this if you have certain conditions and were not previously vaccinated.  Pneumococcal polysaccharide (PPSV23) vaccine. You may need one or two doses if you smoke cigarettes or if you have certain conditions.  Meningococcal vaccine. You may need this if you have certain conditions.  Hepatitis A vaccine. You may need this if you have certain conditions or if you travel or work in places where you may be exposed to hepatitis A.  Hepatitis B vaccine. You may need this if you have certain conditions or if you travel or work in places where you may be exposed to hepatitis B.  Haemophilus influenzae type b (Hib) vaccine. You may need this if you have certain conditions.  Talk to your health care provider about which screenings and vaccines you need and how often you need them. This information is not intended to replace advice given to you by your health care provider. Make sure you discuss any questions you have with your health care provider. Document Released: 11/24/2015 Document Revised: 07/17/2016 Document Reviewed: 08/29/2015 Elsevier Interactive Patient Education  2017 [employer].

## 2017-06-28 ENCOUNTER — Other Ambulatory Visit: Payer: Self-pay | Admitting: Family Medicine

## 2017-06-28 MED ORDER — ONDANSETRON 4 MG PO TBDP: 4.0000 mg | ORAL_TABLET | Freq: Three times a day (TID) | ORAL | 0 refills | Status: DC | PRN

## 2017-06-30 ENCOUNTER — Encounter: Payer: Self-pay | Admitting: Psychology
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
## 2017-07-15 ENCOUNTER — Encounter: Payer: Medicare Other | Admitting: Gynecology

## 2017-07-18 ENCOUNTER — Encounter: Payer: Self-pay | Admitting: Psychology

## 2017-07-18 ENCOUNTER — Encounter: Payer: No Typology Code available for payment source | Attending: Psychology | Admitting: Psychology

## 2017-07-18 DIAGNOSIS — F0630 Mood disorder due to known physiological condition, unspecified: Secondary | ICD-10-CM

## 2017-07-18 DIAGNOSIS — F39 Unspecified mood [affective] disorder: Secondary | ICD-10-CM | POA: Diagnosis not present

## 2017-07-18 DIAGNOSIS — F064 Anxiety disorder due to known physiological condition: Secondary | ICD-10-CM | POA: Diagnosis not present
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
## 2017-08-06 ENCOUNTER — Encounter: Payer: Medicare Other | Admitting: Gynecology

## 2017-08-15 ENCOUNTER — Encounter: Payer: Medicare Other | Attending: Psychology | Admitting: Psychology

## 2017-08-15 DIAGNOSIS — F0630 Mood disorder due to known physiological condition, unspecified: Secondary | ICD-10-CM | POA: Diagnosis not present

## 2017-08-15 DIAGNOSIS — F064 Anxiety disorder due to known physiological condition: Secondary | ICD-10-CM | POA: Insufficient documentation

## 2017-08-15 DIAGNOSIS — F39 Unspecified mood [affective] disorder: Secondary | ICD-10-CM | POA: Diagnosis not present
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
## 2017-09-15 ENCOUNTER — Ambulatory Visit: Payer: Self-pay

## 2017-09-24 ENCOUNTER — Ambulatory Visit (INDEPENDENT_AMBULATORY_CARE_PROVIDER_SITE_OTHER): Payer: Medicare Other | Admitting: Gynecology

## 2017-09-24 ENCOUNTER — Encounter: Payer: Self-pay | Admitting: Gynecology

## 2017-09-24 VITALS — BP 118/78 | Ht 64.0 in | Wt 146.0 lb

## 2017-09-24 DIAGNOSIS — Z01419 Encounter for gynecological examination (general) (routine) without abnormal findings: Secondary | ICD-10-CM

## 2017-09-24 NOTE — Progress Notes (Signed)
    Sol PasserGeorgianna R Kreamer January 12, 1971 [id number]        46 y.o.  G1P0010 for annual gynecologic exam.  Doing well without complaints.  Past medical history,surgical history, problem list, medications, allergies, family history and social history were all reviewed and documented as reviewed in the EPIC chart.  ROS:  Performed with pertinent positives and negatives included in the history, assessment and plan.   Additional significant findings : None   Exam: Kennon PortelaKim Gardner assistant Vitals:   09/24/17 1410  BP: 118/78  Weight: 146 lb (66.2 kg)  Height: 5\' 4"  (1.626 m)   Body mass index is 25.06 kg/m.  General appearance:  Normal affect, orientation and appearance. Skin: Grossly normal HEENT: Without gross lesions.  No cervical or supraclavicular adenopathy. Thyroid normal.  Lungs:  Clear without wheezing, rales or rhonchi Cardiac: RR, without RMG Abdominal:  Soft, nontender, without masses, guarding, rebound, organomegaly or hernia Breasts:  Examined lying and sitting without masses, retractions, discharge or axillary adenopathy. Pelvic:  Ext, BUS, Vagina: Normal  Cervix: Normal  Uterus: Anteverted, normal size, shape and contour, midline and mobile nontender   Adnexa: Without masses or tenderness    Anus and perineum: Normal   Rectovaginal: Normal sphincter tone without palpated masses or tenderness.    Assessment/Plan:  46 y.o. 571P0010 female for annual gynecologic exam with regular menses, abstinent contraception.   1. Mammography overdue and I reminded the patient to schedule this..  She agrees to call and schedule.  Breast exam normal today. 2. Pap smear/HPV 2017 negative.  No Pap smear done today.  History of CIN-1 2002 with no treatment and normal Pap smears afterwards.  Plan repeat Pap smear at 5-year interval per current screening guidelines. 3. Health maintenance.  No routine lab work done today as patient reports is done elsewhere.  Follow-up 1 year, sooner as  needed.   Dara LordsFONTAINE,Craven Crean P MD, 2:37 PM 09/24/2017

## 2017-09-24 NOTE — Patient Instructions (Signed)
Schedule your mammogram.  Follow-up in 1 year for your annual exam

## 2017-09-25 ENCOUNTER — Ambulatory Visit (INDEPENDENT_AMBULATORY_CARE_PROVIDER_SITE_OTHER): Payer: Medicare Other | Admitting: *Deleted

## 2017-09-25 DIAGNOSIS — Z23 Encounter for immunization: Secondary | ICD-10-CM | POA: Diagnosis not present

## 2017-09-29 ENCOUNTER — Encounter: Payer: Self-pay | Admitting: Psychology

## 2017-09-29 ENCOUNTER — Encounter: Payer: Medicare Other | Attending: Psychology | Admitting: Psychology

## 2017-09-29 DIAGNOSIS — F064 Anxiety disorder due to known physiological condition: Secondary | ICD-10-CM

## 2017-09-29 DIAGNOSIS — F419 Anxiety disorder, unspecified: Secondary | ICD-10-CM

## 2017-09-29 DIAGNOSIS — F39 Unspecified mood [affective] disorder: Secondary | ICD-10-CM | POA: Insufficient documentation

## 2017-09-29 DIAGNOSIS — F0630 Mood disorder due to known physiological condition, unspecified: Secondary | ICD-10-CM
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
## 2017-10-13 ENCOUNTER — Encounter: Payer: Medicare Other | Attending: Psychology | Admitting: Psychology

## 2017-10-13 DIAGNOSIS — F39 Unspecified mood [affective] disorder: Secondary | ICD-10-CM | POA: Insufficient documentation

## 2017-10-13 DIAGNOSIS — F0630 Mood disorder due to known physiological condition, unspecified: Secondary | ICD-10-CM

## 2017-10-13 DIAGNOSIS — F064 Anxiety disorder due to known physiological condition: Secondary | ICD-10-CM | POA: Insufficient documentation

## 2017-10-15 ENCOUNTER — Ambulatory Visit: Payer: Self-pay

## 2017-10-16 ENCOUNTER — Encounter: Payer: Self-pay | Admitting: Psychology
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
## 2017-10-22 ENCOUNTER — Encounter: Payer: Medicare Other | Admitting: Gynecology

## 2017-11-17 ENCOUNTER — Encounter: Payer: Medicare Other | Attending: Psychology | Admitting: Psychology

## 2017-11-17 ENCOUNTER — Ambulatory Visit
Admission: RE | Admit: 2017-11-17 | Discharge: 2017-11-17 | Disposition: A | Discharge status: Home / Self Care | Payer: Medicare Other | Source: Ambulatory Visit | Attending: Pediatrics | Admitting: Pediatrics

## 2017-11-17 DIAGNOSIS — F0630 Mood disorder due to known physiological condition, unspecified: Secondary | ICD-10-CM

## 2017-11-17 DIAGNOSIS — F39 Unspecified mood [affective] disorder: Secondary | ICD-10-CM | POA: Diagnosis present

## 2017-11-17 DIAGNOSIS — F064 Anxiety disorder due to known physiological condition: Secondary | ICD-10-CM | POA: Insufficient documentation

## 2017-11-17 DIAGNOSIS — Z1239 Encounter for other screening for malignant neoplasm of breast: Secondary | ICD-10-CM

## 2017-11-30 ENCOUNTER — Encounter: Payer: Self-pay | Admitting: Psychology
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
## 2017-12-09 ENCOUNTER — Encounter: Payer: Medicare Other | Admitting: Psychology

## 2017-12-09 DIAGNOSIS — F064 Anxiety disorder due to known physiological condition: Secondary | ICD-10-CM

## 2017-12-09 DIAGNOSIS — F0630 Mood disorder due to known physiological condition, unspecified: Secondary | ICD-10-CM

## 2017-12-09 DIAGNOSIS — F39 Unspecified mood [affective] disorder: Secondary | ICD-10-CM | POA: Diagnosis not present

## 2017-12-12 ENCOUNTER — Encounter: Payer: Self-pay | Admitting: Psychology
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
## 2018-01-01 ENCOUNTER — Encounter: Payer: Medicare Other | Admitting: Psychology

## 2018-01-05 ENCOUNTER — Encounter: Payer: Medicare Other | Attending: Psychology | Admitting: Psychology

## 2018-01-05 DIAGNOSIS — F39 Unspecified mood [affective] disorder: Secondary | ICD-10-CM | POA: Diagnosis present

## 2018-01-05 DIAGNOSIS — F0630 Mood disorder due to known physiological condition, unspecified: Secondary | ICD-10-CM | POA: Diagnosis not present

## 2018-01-05 DIAGNOSIS — F064 Anxiety disorder due to known physiological condition: Secondary | ICD-10-CM | POA: Insufficient documentation

## 2018-01-11 ENCOUNTER — Encounter: Payer: Self-pay | Admitting: Psychology
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
## 2018-03-10 ENCOUNTER — Encounter: Payer: Medicare Other | Attending: Psychology | Admitting: Psychology

## 2018-03-10 ENCOUNTER — Encounter: Payer: Self-pay | Admitting: Psychology

## 2018-03-10 DIAGNOSIS — F0630 Mood disorder due to known physiological condition, unspecified: Secondary | ICD-10-CM | POA: Diagnosis not present

## 2018-03-10 DIAGNOSIS — F064 Anxiety disorder due to known physiological condition: Secondary | ICD-10-CM | POA: Insufficient documentation

## 2018-03-10 DIAGNOSIS — F39 Unspecified mood [affective] disorder: Secondary | ICD-10-CM | POA: Diagnosis present
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
## 2018-05-07 ENCOUNTER — Encounter: Payer: Self-pay | Admitting: Pediatrics

## 2018-05-07 ENCOUNTER — Ambulatory Visit (INDEPENDENT_AMBULATORY_CARE_PROVIDER_SITE_OTHER): Payer: Medicare Other | Admitting: Pediatrics

## 2018-05-07 VITALS — BP 101/60 | HR 70 | Temp 97.9°F | Ht 64.0 in | Wt 144.6 lb

## 2018-05-07 DIAGNOSIS — J069 Acute upper respiratory infection, unspecified: Secondary | ICD-10-CM | POA: Diagnosis not present

## 2018-05-07 DIAGNOSIS — N898 Other specified noninflammatory disorders of vagina: Secondary | ICD-10-CM | POA: Diagnosis not present

## 2018-05-07 LAB — URINALYSIS, COMPLETE
Bilirubin, UA: NEGATIVE
GLUCOSE, UA: NEGATIVE
KETONES UA: NEGATIVE
Nitrite, UA: NEGATIVE
PROTEIN UA: NEGATIVE
SPEC GRAV UA: 1.01 (ref 1.005–1.030)
Urobilinogen, Ur: 0.2 mg/dL (ref 0.2–1.0)
pH, UA: 6 (ref 5.0–7.5)

## 2018-05-07 LAB — MICROSCOPIC EXAMINATION
Epithelial Cells (non renal): >10 /hpf — AB (ref 0–10)
RENAL EPITHEL UA: NONE SEEN /HPF

## 2018-05-07 LAB — WET PREP FOR TRICH, YEAST, CLUE
CLUE CELL EXAM: POSITIVE — AB
Trichomonas Exam: NEGATIVE
Yeast Exam: NEGATIVE

## 2018-05-07 NOTE — Progress Notes (Signed)
  Subjective:   Patient ID: Cristina Soto, female    DOB: 08/24/1971, 47 y.o.   MRN: [id number] CC: Vaginal Itching  HPI: Cristina Soto is a 47 y.o. female   Past 2 days has had vaginal itching.  Tried using a prescription steroid cream in the area, helped for 24 hours then symptoms got worse.  No change in vaginal discharge.  Having regular periods.  Patient is not sexually active x 14 yrs.  URI symptoms that come and go.  No symptoms now, had some sinus drainage yesterday.  Not taking anything for it right now.  Relevant past medical, surgical, family and social history reviewed. Allergies and medications reviewed and updated. Social History   Tobacco Use  Smoking Status Never Smoker  Smokeless Tobacco Never Used   ROS: Per HPI   Objective:    BP 101/60   Pulse 70   Temp 97.9 F (36.6 C) (Oral)   Ht 5\' 4"  (1.626 m)   Wt 144 lb 9.6 oz (65.6 kg)   BMI 24.82 kg/m   Wt Readings from Last 3 Encounters:  05/07/18 144 lb 9.6 oz (65.6 kg)  09/24/17 146 lb (66.2 kg)  06/18/17 145 lb (65.8 kg)    Gen: NAD, alert, cooperative with exam, NCAT EYES: EOMI, no conjunctival injection, or no icterus ENT:  TMs pearly gray b/l, OP without erythema LYMPH: no cervical LAD CV: NRRR, normal S1/S2, no murmur, distal pulses 2+ b/l Resp: CTABL, no wheezes, normal WOB Abd: +BS, soft, NTND. no guarding or organomegaly Ext: No edema, warm Neuro: Alert and oriented  Declined GU exam  Assessment & Plan:  Cristina Soto was seen today for vaginal itching.  Diagnoses and all orders for this visit:  Vaginal itching UA unremarkable, will follow-up culture.  Recommend avoiding soaps and fragrances around the vagina.  Can try Replens for vaginal dryness. -     Urinalysis, Complete -     Urine Culture; Future -     WET PREP FOR TRICH, YEAST, CLUE  Acute URI Discussed symptomatic care, return precautions.  Follow up plan: As needed. Rex Krasarol Ji Feldner, MD [facility] Review doing thank you I think it stronger since so this dryness is back just

## 2018-05-07 NOTE — Addendum Note (Signed)
Addended by: Orma RenderHODGES, Gresham Caetano F on: 05/07/2018 03:14 PM   Modules accepted: Orders

## 2018-05-07 NOTE — Patient Instructions (Addendum)
Monostat for possible yeast infection  Replens for vaginal dryness  Avoid soaps, fragrances in genital area  Fever reducer and headache: tylenol and ibuprofen, can take together or alternating   Sinus pressure:  Nasal steroid such as flonase/fluticaone or nasocort daily Can also take daily antihistamine such as loratadine/claritin or cetirizine/zyrtec Decongestant such as phenylephrine, but take in the morning or may keep you awake at night  Sinus rinses/irritation: Netipot or similar with distilled water 2-3 times a day to clear out sinuses or Normal saline nasal spray  Sore throat:  Throat lozenges chloroseptic spray  Stick with bland foods Drink lots of fluids

## 2018-05-08 ENCOUNTER — Telehealth: Payer: Self-pay | Admitting: Pediatrics

## 2018-05-08 ENCOUNTER — Other Ambulatory Visit: Payer: Self-pay | Admitting: Pediatrics

## 2018-05-08 DIAGNOSIS — N760 Acute vaginitis: Principal | ICD-10-CM

## 2018-05-08 DIAGNOSIS — B9689 Other specified bacterial agents as the cause of diseases classified elsewhere: Secondary | ICD-10-CM

## 2018-05-08 LAB — URINE CULTURE

## 2018-05-08 MED ORDER — METRONIDAZOLE 0.75 % VA GEL: 1.0000 | Freq: Two times a day (BID) | VAGINAL | 0 refills | Status: AC

## 2018-05-11 ENCOUNTER — Encounter (HOSPITAL_COMMUNITY): Payer: Self-pay | Admitting: Psychology

## 2018-05-11 ENCOUNTER — Encounter: Payer: Medicare Other | Attending: Psychology | Admitting: Psychology

## 2018-05-11 ENCOUNTER — Encounter: Payer: Self-pay | Admitting: Psychology

## 2018-05-11 DIAGNOSIS — F39 Unspecified mood [affective] disorder: Secondary | ICD-10-CM | POA: Insufficient documentation

## 2018-05-11 DIAGNOSIS — F064 Anxiety disorder due to known physiological condition: Secondary | ICD-10-CM | POA: Diagnosis not present

## 2018-05-11 DIAGNOSIS — F0630 Mood disorder due to known physiological condition, unspecified: Secondary | ICD-10-CM
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
## 2018-05-11 NOTE — Telephone Encounter (Signed)
Patient notified

## 2018-06-05 ENCOUNTER — Encounter: Payer: Medicare Other | Admitting: Psychology

## 2018-06-26 ENCOUNTER — Encounter: Payer: Medicare Other | Attending: Psychology | Admitting: Psychology

## 2018-06-26 DIAGNOSIS — F064 Anxiety disorder due to known physiological condition: Secondary | ICD-10-CM | POA: Diagnosis not present

## 2018-06-26 DIAGNOSIS — F39 Unspecified mood [affective] disorder: Secondary | ICD-10-CM | POA: Diagnosis present

## 2018-06-26 DIAGNOSIS — F0630 Mood disorder due to known physiological condition, unspecified: Secondary | ICD-10-CM

## 2018-07-02 ENCOUNTER — Ambulatory Visit: Payer: Medicare Other | Admitting: Psychology

## 2018-07-23 ENCOUNTER — Encounter: Payer: Self-pay | Admitting: Psychology
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
## 2018-07-30 ENCOUNTER — Encounter: Payer: Medicare Other | Attending: Psychology | Admitting: Psychology

## 2018-07-30 DIAGNOSIS — F0630 Mood disorder due to known physiological condition, unspecified: Secondary | ICD-10-CM

## 2018-07-30 DIAGNOSIS — F064 Anxiety disorder due to known physiological condition: Secondary | ICD-10-CM | POA: Insufficient documentation

## 2018-07-30 DIAGNOSIS — F418 Other specified anxiety disorders: Secondary | ICD-10-CM | POA: Diagnosis not present

## 2018-07-30 DIAGNOSIS — F39 Unspecified mood [affective] disorder: Secondary | ICD-10-CM | POA: Diagnosis not present

## 2018-08-02 ENCOUNTER — Encounter: Payer: Self-pay | Admitting: Psychology
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
## 2018-08-28 ENCOUNTER — Encounter: Payer: Medicare Other | Attending: Psychology | Admitting: Psychology

## 2018-08-28 DIAGNOSIS — F418 Other specified anxiety disorders: Secondary | ICD-10-CM | POA: Diagnosis not present

## 2018-08-28 DIAGNOSIS — F0630 Mood disorder due to known physiological condition, unspecified: Secondary | ICD-10-CM

## 2018-08-28 DIAGNOSIS — F064 Anxiety disorder due to known physiological condition: Secondary | ICD-10-CM | POA: Insufficient documentation

## 2018-08-28 DIAGNOSIS — F39 Unspecified mood [affective] disorder: Secondary | ICD-10-CM | POA: Insufficient documentation

## 2018-09-02 ENCOUNTER — Encounter: Payer: Self-pay | Admitting: Psychology
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
## 2018-09-25 ENCOUNTER — Ambulatory Visit: Payer: Medicare Other | Admitting: Physician Assistant

## 2018-09-28 ENCOUNTER — Ambulatory Visit (INDEPENDENT_AMBULATORY_CARE_PROVIDER_SITE_OTHER): Payer: Medicare Other | Admitting: Pediatrics

## 2018-09-28 ENCOUNTER — Encounter: Payer: Self-pay | Admitting: Pediatrics

## 2018-09-28 VITALS — BP 111/57 | HR 92 | Temp 97.3°F | Ht 64.0 in | Wt 146.0 lb

## 2018-09-28 DIAGNOSIS — J069 Acute upper respiratory infection, unspecified: Secondary | ICD-10-CM | POA: Diagnosis not present

## 2018-09-28 NOTE — Patient Instructions (Signed)

## 2018-09-28 NOTE — Progress Notes (Signed)
  Subjective:   Patient ID: Cristina Soto, female    DOB: November 01, 1971, 47 y.o.   MRN: [id number] CC: Cough (colored sputum)   HPI: Cristina Soto is a 47 y.o. female   Symptoms ongoing for the last 3 to 4 days.  Some sore throat.  Lots of nasal congestion and sinus drainage.  Cough bothering her the most.  Sometimes coughing up small amounts of sputum.  No shortness of breath.  Sometimes feels tight in her chest when she takes a deep breath.  Started when she started getting sick.  Not present all the time.  Relevant past medical, surgical, family and social history reviewed. Allergies and medications reviewed and updated. Social History   Tobacco Use  Smoking Status Never Smoker  Smokeless Tobacco Never Used   ROS: Per HPI   Objective:    BP (!) 111/57   Pulse 92   Temp (!) 97.3 F (36.3 C)   Ht 5\' 4"  (1.626 m)   Wt 146 lb (66.2 kg)   BMI 25.06 kg/m   Wt Readings from Last 3 Encounters:  09/28/18 146 lb (66.2 kg)  05/07/18 144 lb 9.6 oz (65.6 kg)  09/24/17 146 lb (66.2 kg)    Gen: NAD, alert, cooperative with exam, NCAT EYES: EOMI, no conjunctival injection, or no icterus ENT:  TMs pearly gray b/l, OP without erythema LYMPH: no cervical LAD CV: NRRR, normal S1/S2, no murmur, distal pulses 2+ b/l Resp: CTABL, no wheezes, normal WOB Abd: +BS, soft, NTND.  Ext: No edema, warm Neuro: Alert and oriented, strength equal b/l UE and LE, coordination grossly normal MSK: normal muscle bulk  Assessment & Plan:  Cristina Soto was seen today for cough.   Diagnoses and all orders for this visit:  Acute URI Discussed symptomatic care and return precautions.  Follow up plan: Return if symptoms worsen or fail to improve. Rex Krasarol , MD [facility]

## 2018-10-09 ENCOUNTER — Encounter: Payer: Self-pay | Admitting: Nurse Practitioner

## 2018-10-09 ENCOUNTER — Ambulatory Visit: Payer: Medicare Other | Admitting: Nurse Practitioner

## 2018-10-09 VITALS — BP 121/59 | HR 88 | Temp 97.7°F | Ht 64.0 in | Wt 144.0 lb

## 2018-10-09 DIAGNOSIS — J029 Acute pharyngitis, unspecified: Secondary | ICD-10-CM | POA: Diagnosis not present

## 2018-10-09 LAB — CULTURE, GROUP A STREP

## 2018-10-09 LAB — RAPID STREP SCREEN (MED CTR MEBANE ONLY): STREP GP A AG, IA W/REFLEX: NEGATIVE

## 2018-10-09 NOTE — Patient Instructions (Signed)

## 2018-10-09 NOTE — Progress Notes (Signed)
   Subjective:    Patient ID: Cristina Soto, female    DOB: 01-May-1971, 47 y.o.   MRN: [id number]   Chief Complaint: Sore Throat   HPI Patient comes in today c/o sore throat. Started about 3 days ago. She saw DR. Vincent last week and was dx with viral URI. Was really sore yesterday.    Review of Systems  Constitutional: Negative for chills and fever.  HENT: Positive for sore throat. Negative for congestion, ear pain, postnasal drip, rhinorrhea, trouble swallowing and voice change.   Respiratory: Negative for cough.   Cardiovascular: Negative.   Gastrointestinal: Negative.   Neurological: Negative for headaches.  Psychiatric/Behavioral: Negative.   All other systems reviewed and are negative.      Objective:   Physical Exam  Constitutional: She is oriented to person, place, and time. She appears well-developed and well-nourished. No distress.  HENT:  Head: Normocephalic.  Right Ear: Hearing, tympanic membrane and ear canal normal.  Left Ear: Hearing, tympanic membrane and ear canal normal.  Mouth/Throat: Uvula is midline and mucous membranes are normal. Posterior oropharyngeal erythema (mild) present. Tonsils are 0 on the right. Tonsils are 0 on the left.  Eyes: Pupils are equal, round, and reactive to light.  Neck: Normal range of motion.  Cardiovascular: Normal rate.  Pulmonary/Chest: Effort normal.  Neurological: She is alert and oriented to person, place, and time.  Skin: Skin is warm.  Psychiatric: She has a normal mood and affect. Her behavior is normal.  Nursing note and vitals reviewed.  BP (!) 121/59   Pulse 88   Temp 97.7 F (36.5 C) (Oral)   Ht 5\' 4"  (1.626 m)   Wt 144 lb (65.3 kg)   BMI 24.72 kg/m    Strep neagtive       Assessment & Plan:  Cristina Soto in today with chief complaint of Sore Throat   1. Sore throat - Rapid Strep Screen ([facility] ONLY)  2. Viral pharyngitis Force fluids Motrin or tylenol OTC OTC  decongestant Throat lozenges if help New toothbrush in 3 days  Mary-Margaret Daphine DeutscherMartin, FNP

## 2018-10-19 ENCOUNTER — Encounter: Payer: Medicare Other | Attending: Psychology | Admitting: Psychology

## 2018-10-19 DIAGNOSIS — F0630 Mood disorder due to known physiological condition, unspecified: Secondary | ICD-10-CM

## 2018-10-19 DIAGNOSIS — F064 Anxiety disorder due to known physiological condition: Secondary | ICD-10-CM | POA: Diagnosis not present

## 2018-10-19 DIAGNOSIS — F39 Unspecified mood [affective] disorder: Secondary | ICD-10-CM | POA: Insufficient documentation

## 2018-10-20 ENCOUNTER — Encounter: Payer: Self-pay | Admitting: Psychology
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
## 2018-11-13 DIAGNOSIS — R490 Dysphonia: Secondary | ICD-10-CM | POA: Insufficient documentation

## 2018-11-13 DIAGNOSIS — J329 Chronic sinusitis, unspecified: Secondary | ICD-10-CM | POA: Insufficient documentation

## 2018-11-17 ENCOUNTER — Telehealth: Payer: Self-pay | Admitting: Pediatrics

## 2018-11-24 ENCOUNTER — Ambulatory Visit: Payer: Medicare Other | Admitting: Psychology

## 2018-12-01 ENCOUNTER — Encounter: Payer: Medicare Other | Attending: Psychology | Admitting: Psychology

## 2018-12-01 ENCOUNTER — Encounter: Payer: Self-pay | Admitting: Psychology

## 2018-12-01 DIAGNOSIS — F064 Anxiety disorder due to known physiological condition: Secondary | ICD-10-CM | POA: Insufficient documentation

## 2018-12-01 DIAGNOSIS — F0630 Mood disorder due to known physiological condition, unspecified: Secondary | ICD-10-CM | POA: Diagnosis not present

## 2018-12-01 DIAGNOSIS — F419 Anxiety disorder, unspecified: Secondary | ICD-10-CM | POA: Diagnosis not present
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
## 2018-12-07 ENCOUNTER — Encounter: Payer: Medicare Other | Admitting: Psychology

## 2018-12-07 DIAGNOSIS — F064 Anxiety disorder due to known physiological condition: Secondary | ICD-10-CM | POA: Diagnosis not present

## 2018-12-07 DIAGNOSIS — F0630 Mood disorder due to known physiological condition, unspecified: Secondary | ICD-10-CM | POA: Diagnosis not present

## 2018-12-09 ENCOUNTER — Encounter: Payer: Self-pay | Admitting: Psychology
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
## 2018-12-11 ENCOUNTER — Ambulatory Visit: Payer: Medicare Other

## 2018-12-14 ENCOUNTER — Ambulatory Visit (INDEPENDENT_AMBULATORY_CARE_PROVIDER_SITE_OTHER): Payer: Medicare Other | Admitting: *Deleted

## 2018-12-14 DIAGNOSIS — Z23 Encounter for immunization: Secondary | ICD-10-CM

## 2019-01-07 ENCOUNTER — Encounter: Payer: Medicare Other | Admitting: Psychology

## 2019-01-18 ENCOUNTER — Ambulatory Visit: Payer: Medicare Other | Admitting: Psychology

## 2019-01-28 ENCOUNTER — Ambulatory Visit: Payer: Medicare Other | Admitting: Psychology

## 2019-03-09 ENCOUNTER — Other Ambulatory Visit: Payer: Self-pay

## 2019-03-09 ENCOUNTER — Telehealth: Payer: Self-pay | Admitting: Family Medicine

## 2019-03-09 ENCOUNTER — Encounter: Payer: Medicare Other | Attending: Psychology | Admitting: Psychology

## 2019-03-09 DIAGNOSIS — F39 Unspecified mood [affective] disorder: Secondary | ICD-10-CM | POA: Insufficient documentation

## 2019-03-09 DIAGNOSIS — F064 Anxiety disorder due to known physiological condition: Secondary | ICD-10-CM | POA: Diagnosis not present

## 2019-03-09 DIAGNOSIS — F419 Anxiety disorder, unspecified: Secondary | ICD-10-CM | POA: Diagnosis not present

## 2019-03-09 DIAGNOSIS — F0630 Mood disorder due to known physiological condition, unspecified: Secondary | ICD-10-CM

## 2019-03-16 ENCOUNTER — Encounter: Payer: Self-pay | Admitting: Psychology
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
## 2019-03-17 ENCOUNTER — Ambulatory Visit (INDEPENDENT_AMBULATORY_CARE_PROVIDER_SITE_OTHER): Payer: Medicare Other | Admitting: Family

## 2019-03-17 ENCOUNTER — Encounter: Payer: Self-pay | Admitting: Family

## 2019-03-17 ENCOUNTER — Other Ambulatory Visit: Payer: Self-pay

## 2019-03-17 ENCOUNTER — Ambulatory Visit (INDEPENDENT_AMBULATORY_CARE_PROVIDER_SITE_OTHER): Payer: Medicare Other | Admitting: *Deleted

## 2019-03-17 ENCOUNTER — Encounter: Payer: Self-pay | Admitting: *Deleted

## 2019-03-17 VITALS — BP 112/71 | HR 91 | Temp 98.9°F | Ht 64.0 in | Wt 141.0 lb

## 2019-03-17 DIAGNOSIS — Z0000 Encounter for general adult medical examination without abnormal findings: Secondary | ICD-10-CM | POA: Diagnosis not present

## 2019-03-17 DIAGNOSIS — N898 Other specified noninflammatory disorders of vagina: Secondary | ICD-10-CM | POA: Diagnosis not present

## 2019-03-17 DIAGNOSIS — B3731 Acute candidiasis of vulva and vagina: Secondary | ICD-10-CM

## 2019-03-17 DIAGNOSIS — B373 Candidiasis of vulva and vagina: Secondary | ICD-10-CM | POA: Diagnosis not present

## 2019-03-17 LAB — URINALYSIS, COMPLETE
Bilirubin, UA: NEGATIVE
Glucose, UA: NEGATIVE
Ketones, UA: NEGATIVE
Leukocytes,UA: NEGATIVE
Nitrite, UA: NEGATIVE
Protein,UA: NEGATIVE
Specific Gravity, UA: 1.015 (ref 1.005–1.030)
Urobilinogen, Ur: 0.2 mg/dL (ref 0.2–1.0)
pH, UA: 5.5 (ref 5.0–7.5)

## 2019-03-17 LAB — WET PREP FOR TRICH, YEAST, CLUE
Clue Cell Exam: NEGATIVE
Trichomonas Exam: NEGATIVE
Yeast Exam: POSITIVE — AB

## 2019-03-17 LAB — MICROSCOPIC EXAMINATION: Renal Epithel, UA: NONE SEEN /hpf

## 2019-03-17 MED ORDER — FLUCONAZOLE 150 MG PO TABS: 150.0000 mg | ORAL_TABLET | ORAL | 0 refills | Status: DC | PRN

## 2019-03-17 NOTE — Patient Instructions (Signed)
Vaginal Yeast infection, Adult    Vaginal yeast infection is a condition that causes vaginal discharge as well as soreness, swelling, and redness (inflammation) of the vagina. This is a common condition. Some women get this infection frequently.  What are the causes?  This condition is caused by a change in the normal balance of the yeast (candida) and bacteria that live in the vagina. This change causes an overgrowth of yeast, which causes the inflammation.  What increases the risk?  The condition is more likely to develop in women who:   Take antibiotic medicines.   Have diabetes.   Take birth control pills.   Are pregnant.   Douche often.   Have a weak body defense system (immune system).   Have been taking steroid medicines for a long time.   Frequently wear tight clothing.  What are the signs or symptoms?  Symptoms of this condition include:   White, thick, creamy vaginal discharge.   Swelling, itching, redness, and irritation of the vagina. The lips of the vagina (vulva) may be affected as well.   Pain or a burning feeling while urinating.   Pain during sex.  How is this diagnosed?  This condition is diagnosed based on:   Your medical history.   A physical exam.   A pelvic exam. Your health care provider will examine a sample of your vaginal discharge under a microscope. Your health care provider may send this sample for testing to confirm the diagnosis.  How is this treated?  This condition is treated with medicine. Medicines may be over-the-counter or prescription. You may be told to use one or more of the following:   Medicine that is taken by mouth (orally).   Medicine that is applied as a cream (topically).   Medicine that is inserted directly into the vagina (suppository).  Follow these instructions at home:    Lifestyle   Do not have sex until your health care provider approves. Tell your sex partner that you have a yeast infection. That person should go to his or her health care  provider and ask if they should also be treated.   Do not wear tight clothes, such as pantyhose or tight pants.   Wear breathable cotton underwear.  General instructions   Take or apply over-the-counter and prescription medicines only as told by your health care provider.   Eat more yogurt. This may help to keep your yeast infection from returning.   Do not use tampons until your health care provider approves.   Try taking a sitz bath to help with discomfort. This is a warm water bath that is taken while you are sitting down. The water should only come up to your hips and should cover your buttocks. Do this 3-4 times per day or as told by your health care provider.   Do not douche.   If you have diabetes, keep your blood sugar levels under control.   Keep all follow-up visits as told by your health care provider. This is important.  Contact a health care provider if:   You have a fever.   Your symptoms go away and then return.   Your symptoms do not get better with treatment.   Your symptoms get worse.   You have new symptoms.   You develop blisters in or around your vagina.   You have blood coming from your vagina and it is not your menstrual period.   You develop pain in your abdomen.  Summary     Vaginal yeast infection is a condition that causes discharge as well as soreness, swelling, and redness (inflammation) of the vagina.   This condition is treated with medicine. Medicines may be over-the-counter or prescription.   Take or apply over-the-counter and prescription medicines only as told by your health care provider.   Do not douche. Do not have sex or use tampons until your health care provider approves.   Contact a health care provider if your symptoms do not get better with treatment or your symptoms go away and then return.  This information is not intended to replace advice given to you by your health care provider. Make sure you discuss any questions you have with your health care  provider.  Document Released: 08/07/2005 Document Revised: 03/16/2018 Document Reviewed: 03/16/2018  Elsevier Interactive Patient Education  2019 [employer]

## 2019-03-17 NOTE — Patient Instructions (Signed)
  Cristina Soto , Thank you for taking time to talk with me for your Medicare Wellness Visit. I appreciate your ongoing commitment to your health goals. Please review the following plan we discussed and let me know if I can assist you in the future.   These are the goals we discussed: Goals    . Exercise 3x per week (30 min per time)     Increase cardiovascular exercise such as walking or stationary bike to 3-4 times per week for 30 minutes each session.         This is a list of the screening recommended for you and due dates:  Health Maintenance  Topic Date Due  . HIV Screening  03/31/1985  . Tetanus Vaccine  03/31/1989  . Flu Shot  06/12/2019  . Pap Smear  08/10/2019

## 2019-03-17 NOTE — Progress Notes (Signed)
   Subjective:    Patient ID: Cristina Soto, female    DOB: [birth year], 48 y.o.   MRN: [id number]  Chief Complaint  Patient presents with  . Vaginal Itching    Vaginal Itching  The patient's primary symptoms include genital itching and vaginal discharge. The patient's pertinent negatives include no vaginal bleeding. The current episode started in the past 7 days. The problem has been waxing and waning. Pertinent negatives include no back pain, chills, dysuria, frequency, hematuria or urgency. The vaginal discharge was grey.      Review of Systems  Constitutional: Negative for chills.  Genitourinary: Positive for vaginal discharge. Negative for dysuria, frequency, hematuria and urgency.  Musculoskeletal: Negative for back pain.  All other systems reviewed and are negative.      Objective:   Physical Exam Vitals signs reviewed.  Constitutional:      General: She is not in acute distress.    Appearance: She is well-developed.  HENT:     Head: Normocephalic and atraumatic.  Eyes:     Pupils: Pupils are equal, round, and reactive to light.  Neck:     Musculoskeletal: Normal range of motion and neck supple.     Thyroid: No thyromegaly.  Cardiovascular:     Rate and Rhythm: Normal rate and regular rhythm.     Heart sounds: Normal heart sounds. No murmur.  Pulmonary:     Effort: Pulmonary effort is normal. No respiratory distress.     Breath sounds: Normal breath sounds. No wheezing.  Abdominal:     General: Bowel sounds are normal. There is no distension.     Palpations: Abdomen is soft.     Tenderness: There is abdominal tenderness (mild lower abdmen).  Musculoskeletal: Normal range of motion.        General: No tenderness.  Skin:    General: Skin is warm and dry.  Neurological:     Mental Status: She is alert and oriented to person, place, and time.     Cranial Nerves: No cranial nerve deficit.     Deep Tendon Reflexes: Reflexes are normal and symmetric.   Psychiatric:        Behavior: Behavior normal.        Thought Content: Thought content normal.        Judgment: Judgment normal.     BP 112/71   Pulse 91   Temp 98.9 F (37.2 C) (Oral)   Ht 5\' 4"  (1.626 m)   Wt 141 lb (64 kg)   BMI 24.20 kg/m        Assessment & Plan:  ELZBIETA COTTERILL comes in today with chief complaint of Vaginal Itching   Diagnosis and orders addressed:  1. Vagina itching - WET PREP FOR TRICH, YEAST, CLUE - Urinalysis, Complete  2. Vagina, candidiasis Keep clean and dry Start probiotic  Avoid bubble baths RTO if symptoms worsen or do not improve  - fluconazole (DIFLUCAN) 150 MG tablet; Take 1 tablet (150 mg total) by mouth every three (3) days as needed.  Dispense: 3 tablet; Refill: 0  Jannifer Rodney, FNP

## 2019-03-17 NOTE — Progress Notes (Addendum)
MEDICARE ANNUAL WELLNESS VISIT  03/17/2019  Telephone Visit Disclaimer This Medicare AWV was conducted by telephone due to national recommendations for restrictions regarding the COVID-19 Pandemic (e.g. social distancing).  I verified, using two identifiers, that I am speaking with [name] or their authorized healthcare agent. I discussed the limitations, risks, security, and privacy concerns of performing an evaluation and management service by telephone and the potential availability of an in-person appointment in the future. The patient expressed understanding and agreed to proceed.   Subjective:  Cristina Soto is a 48 y.o. female patient of Dettinger, Elige Radon, MD who had a Medicare Annual Wellness Visit today via telephone.  She is on Social Security Disability for anxiety/depression, and fibromyalgia.  She lives in apartment in [place], but spends a few nights per week at her parent's house in [place], [place]. She works part time for [employer].  She has a cat at her apartment in [place] and her parents have 2 cats at their home.  She is very close with her parents and 3 sisters who all live locally.  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   Patient Care Team: Dettinger, Elige Radon, MD as PCP - General (Family Medicine)  Advanced Directives 03/17/2019 06/18/2017  Does Patient Have a Medical Advance Directive? No No  Would patient like information on creating a medical advance directive? Yes (MAU/Ambulatory/Procedural Areas - Information given) Yes (ED - Information included in AVS)    Hospital Utilization Over the Past 12 Months: # of hospitalizations or ER visits: 0 # of surgeries: 0  Review of Systems    Patient reports that her overall health is better compared to last year.    Review of Systems: Genitourinary - positive for vaginal itching  All  other systems negative.  Pain Assessment Pain Score: 3  -discomfort from vaginal itching     Current Medications & Allergies (verified) Allergies as of 03/17/2019      Reactions   Corticosteroids Nausea And Vomiting   Erythromycin Nausea And Vomiting   Promethazine Hcl Nausea And Vomiting   Phenergan   Promethazine Hcl Nausea And Vomiting   Phenergan      Medication List       Accurate as of Mar 17, 2019 10:59 AM. Always use your most recent med list.        ondansetron 4 MG disintegrating tablet Commonly known as:  Zofran ODT Take 1 tablet (4 mg total) by mouth every 8 (eight) hours as needed for nausea or vomiting.   triamcinolone 0.1 % paste Commonly known as:  KENALOG APPLY TO AFFECTED AREA BY PRESSING ON WITH A Q-TIP( DO NOT RUB). USE AT BEDTIME & AT MEALS UPTO 4X       History (reviewed): Past Medical History:  Diagnosis Date  . Anxiety   . Asthma   . Chronic fatigue fibromyalgia syndrome   . CIN I (cervical intraepithelial neoplasia I) 2002  . Depression   . Fibromyalgia   . Hemorrhoids   . Migraine   . Vulvar pruritus    Past Surgical History:  Procedure Laterality Date  . COLPOSCOPY  2002  . DILATION AND CURETTAGE OF UTERUS  1997   TH.AB   Family History  Problem Relation Age of Onset  . Cancer Paternal Grandmother        Lung  . Cancer  Paternal Grandfather        Liver  . Diverticulitis Father   . Diverticulitis Sister   . Asthma Mother   . Colon cancer Neg Hx   . Breast cancer Neg Hx    Social History   Socioeconomic History  . Marital status: Single    Spouse name: Not on file  . Number of children: 0  . Years of education: Not on file  . Highest education level: Bachelor's degree (e.g., BA, AB, BS)  Occupational History  . Not on file  Social Needs  . Financial resource strain: Not hard at all  . Food insecurity:    Worry: Never true    Inability: Never true  . Transportation needs:    Medical: No    Non-medical: No   Tobacco Use  . Smoking status: Never Smoker  . Smokeless tobacco: Never Used  Substance and Sexual Activity  . Alcohol use: No    Alcohol/week: 0.0 standard drinks  . Drug use: No  . Sexual activity: Never    Birth control/protection: Abstinence    Comment: 1st intercourse 48 yo-More than 5 partners  Lifestyle  . Physical activity:    Days per week: 0 days    Minutes per session: 0 min  . Stress: Only a little  Relationships  . Social connections:    Talks on phone: More than three times a week    Gets together: More than three times a week    Attends religious service: More than 4 times per year    Active member of club or organization: Yes    Attends meetings of clubs or organizations: More than 4 times per year    Relationship status: Never married  Other Topics Concern  . Not on file  Social History Narrative  . Not on file    Activities of Daily Living In your present state of health, do you have any difficulty performing the following activities: 03/17/2019  Hearing? N  Vision? N  Difficulty concentrating or making decisions? N  Walking or climbing stairs? N  Dressing or bathing? N  Doing errands, shopping? N  Preparing Food and eating ? N  Using the Toilet? N  In the past six months, have you accidently leaked urine? N  Do you have problems with loss of bowel control? N  Managing your Medications? N  Managing your Finances? N  Housekeeping or managing your Housekeeping? N  Some recent data might be hidden    Patient Literacy    Exercise Current Exercise Habits: Home exercise routine, Type of exercise: yoga, Time (Minutes): 20, Frequency (Times/Week): 3, Weekly Exercise (Minutes/Week): 60, Intensity: Mild  Diet Patient reports consuming 3 meals a day and 1 snack(s) a day Patient reports that her primary diet is: Regular Patient reports that she does have regular access to food.   Depression Screen PHQ 2/9 Scores 03/17/2019 10/09/2018 09/28/2018 05/07/2018  06/18/2017 06/11/2017 04/28/2017  PHQ - 2 Score 0 0 0 0 0 0 0     Fall Risk Fall Risk  03/17/2019 10/09/2018 05/07/2018 06/18/2017 06/11/2017  Falls in the past year? 0 0 No No No  Follow up Education provided;Falls prevention discussed - - - -     Objective:  Keyatta R Swinford seemed alert and oriented and she participated appropriately during our telephone visit.  Blood Pressure Weight BMI  BP Readings from Last 3 Encounters:  10/09/18 (!) 121/59  09/28/18 (!) 111/57  05/07/18 101/60   Wt Readings from  Last 3 Encounters:  10/09/18 144 lb (65.3 kg)  09/28/18 146 lb (66.2 kg)  05/07/18 144 lb 9.6 oz (65.6 kg)   BMI Readings from Last 1 Encounters:  10/09/18 24.72 kg/m    *Unable to obtain current vital signs, weight, and BMI due to telephone visit type  Hearing/Vision  . Amishi did not seem to have difficulty with hearing/understanding during the telephone conversation . Reports that she has not had a formal eye exam by an eye care professional within the past year.  Plans to scheduled when COVID 19 restrictions are lifted. . Reports that she has not had a formal hearing evaluation within the past year *Unable to fully assess hearing and vision during telephone visit type  Cognitive Function: 6CIT Screen 03/17/2019  What Year? 0 points  What month? 0 points  What time? 0 points  Count back from 20 0 points  Months in reverse 0 points  Repeat phrase 0 points  Total Score 0    Normal Cognitive Function Screening: Yes (Normal:0-7, Significant for Dysfunction: >8)  Immunization & Health Maintenance Record Immunization History  Administered Date(s) Administered  . Influenza, Quadrivalent, Recombinant, Inj, Pf 12/17/2016, 09/25/2017, 12/14/2018    Health Maintenance  Topic Date Due  . HIV Screening  03/31/1985  . TETANUS/TDAP  03/31/1989  . INFLUENZA VACCINE  06/12/2019  . PAP SMEAR-Modifier  08/10/2019       Assessment  This is a routine wellness examination for  [employer].  Health Maintenance: Due or Overdue Health Maintenance Due  Topic Date Due  . HIV Screening  03/31/1985  . TETANUS/TDAP  03/31/1989    Delsa Grana [place]ington does not need a referral for Community Assistance: Care Management:   no Social Work:    no Prescription Assistance:  no Nutrition/Diabetes Education:  no   Plan:  Personalized Goals Goals Addressed            This Visit's Progress   . Exercise 3x per week (30 min per time)   Not on track    Increase cardiovascular exercise such as walking or stationary bike to 3-4 times per week for 30 minutes each session.        Personalized Health Maintenance & Screening Recommendations  Td vaccine Screening mammography Advanced directives: has NO advanced directive  - add't info requested. Referral to SW: no  Lung Cancer Screening Recommended: no (Low Dose CT Chest recommended if Age 21-80 years, 30 pack-year currently smoking OR have quit w/in past 15 years) Hepatitis C Screening recommended: no   Advanced Directives: Written information was prepared per patient's request.  Referrals & Orders No orders of the defined types were placed in this encounter.   Follow-up Plan . Follow-up with Dettinger, Elige Radon, MD as planned . Schedule mammogram and eye exam appointments. . Consider getting tdap vaccine at next office visit.    I have personally reviewed and noted the following in the patient's chart:   . Medical and social history . Use of alcohol, tobacco or illicit drugs  . Current medications and supplements . Functional ability and status . Nutritional status . Physical activity . Advanced directives . List of other physicians . Hospitalizations, surgeries, and ER visits in previous 12 months . Vitals . Screenings to include cognitive, depression, and falls . Referrals and appointments  In addition, I have reviewed and discussed with Delsa Grana Mance certain preventive protocols, quality  metrics, and best practice recommendations. A written personalized care plan for preventive services as  well as general preventive health recommendations is available and can be mailed to the patient at her request.      Lilia Arguemy Terrill Wauters, RN  03/17/2019   I have reviewed and agree with the above AWV documentation.   Jannifer Rodneyhristy Hawks, FNP

## 2019-03-23 ENCOUNTER — Other Ambulatory Visit: Payer: Self-pay

## 2019-03-23 ENCOUNTER — Encounter: Payer: Medicare Other | Attending: Psychology | Admitting: Psychology

## 2019-03-23 DIAGNOSIS — F064 Anxiety disorder due to known physiological condition: Secondary | ICD-10-CM | POA: Diagnosis not present

## 2019-03-23 DIAGNOSIS — F0630 Mood disorder due to known physiological condition, unspecified: Secondary | ICD-10-CM | POA: Diagnosis not present

## 2019-03-23 DIAGNOSIS — F39 Unspecified mood [affective] disorder: Secondary | ICD-10-CM | POA: Diagnosis not present

## 2019-03-25 ENCOUNTER — Encounter: Payer: Self-pay | Admitting: Psychology
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
## 2019-06-04 ENCOUNTER — Other Ambulatory Visit: Payer: Self-pay

## 2019-06-04 ENCOUNTER — Encounter: Payer: Medicare Other | Attending: Psychology | Admitting: Psychology

## 2019-06-04 DIAGNOSIS — F064 Anxiety disorder due to known physiological condition: Secondary | ICD-10-CM | POA: Insufficient documentation

## 2019-06-04 DIAGNOSIS — F419 Anxiety disorder, unspecified: Secondary | ICD-10-CM | POA: Diagnosis not present

## 2019-06-04 DIAGNOSIS — F0630 Mood disorder due to known physiological condition, unspecified: Secondary | ICD-10-CM

## 2019-06-04 DIAGNOSIS — F39 Unspecified mood [affective] disorder: Secondary | ICD-10-CM | POA: Insufficient documentation

## 2019-06-09 ENCOUNTER — Encounter: Payer: Self-pay | Admitting: Psychology
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
## 2019-07-01 ENCOUNTER — Encounter: Payer: Medicare Other | Attending: Psychology | Admitting: Psychology

## 2019-07-01 ENCOUNTER — Other Ambulatory Visit: Payer: Self-pay

## 2019-07-01 DIAGNOSIS — F064 Anxiety disorder due to known physiological condition: Secondary | ICD-10-CM | POA: Insufficient documentation

## 2019-07-01 DIAGNOSIS — F0630 Mood disorder due to known physiological condition, unspecified: Secondary | ICD-10-CM

## 2019-07-01 DIAGNOSIS — F39 Unspecified mood [affective] disorder: Secondary | ICD-10-CM | POA: Diagnosis not present

## 2019-07-05 ENCOUNTER — Encounter: Payer: Self-pay | Admitting: Psychology
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
## 2019-08-04 ENCOUNTER — Encounter: Payer: Self-pay | Admitting: Gynecology

## 2019-09-02 ENCOUNTER — Encounter: Payer: Medicare Other | Attending: Psychology | Admitting: Psychology

## 2019-09-02 DIAGNOSIS — F064 Anxiety disorder due to known physiological condition: Secondary | ICD-10-CM | POA: Insufficient documentation

## 2019-09-02 DIAGNOSIS — F39 Unspecified mood [affective] disorder: Secondary | ICD-10-CM | POA: Insufficient documentation

## 2019-09-03 ENCOUNTER — Other Ambulatory Visit: Payer: Self-pay

## 2019-09-03 DIAGNOSIS — Z20822 Contact with and (suspected) exposure to covid-19: Secondary | ICD-10-CM

## 2019-09-05 LAB — NOVEL CORONAVIRUS, NAA: SARS-CoV-2, NAA: NOT DETECTED

## 2019-10-25 ENCOUNTER — Encounter: Payer: Self-pay | Admitting: Psychology

## 2019-10-25 ENCOUNTER — Encounter

## 2019-10-25 ENCOUNTER — Other Ambulatory Visit: Payer: Self-pay

## 2019-10-25 ENCOUNTER — Encounter: Payer: Medicare Other | Attending: Psychology | Admitting: Psychology

## 2019-10-25 DIAGNOSIS — F0630 Mood disorder due to known physiological condition, unspecified: Secondary | ICD-10-CM

## 2019-10-25 DIAGNOSIS — F419 Anxiety disorder, unspecified: Secondary | ICD-10-CM | POA: Diagnosis not present

## 2019-10-25 DIAGNOSIS — F064 Anxiety disorder due to known physiological condition: Secondary | ICD-10-CM | POA: Insufficient documentation

## 2019-10-25 DIAGNOSIS — F39 Unspecified mood [affective] disorder: Secondary | ICD-10-CM | POA: Insufficient documentation
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
## 2019-10-28 ENCOUNTER — Ambulatory Visit: Payer: Medicare Other | Attending: Internal Medicine

## 2019-10-28 DIAGNOSIS — Z20822 Contact with and (suspected) exposure to covid-19: Secondary | ICD-10-CM

## 2019-10-29 LAB — NOVEL CORONAVIRUS, NAA: SARS-CoV-2, NAA: NOT DETECTED

## 2019-11-29 ENCOUNTER — Encounter: Payer: Medicare Other | Attending: Psychology | Admitting: Psychology

## 2019-11-29 ENCOUNTER — Other Ambulatory Visit: Payer: Self-pay

## 2019-11-29 DIAGNOSIS — G43001 Migraine without aura, not intractable, with status migrainosus: Secondary | ICD-10-CM | POA: Diagnosis not present

## 2019-11-29 DIAGNOSIS — F0630 Mood disorder due to known physiological condition, unspecified: Secondary | ICD-10-CM

## 2019-11-29 DIAGNOSIS — F39 Unspecified mood [affective] disorder: Secondary | ICD-10-CM | POA: Diagnosis not present

## 2019-11-29 DIAGNOSIS — F064 Anxiety disorder due to known physiological condition: Secondary | ICD-10-CM | POA: Insufficient documentation

## 2019-11-29 DIAGNOSIS — F419 Anxiety disorder, unspecified: Secondary | ICD-10-CM | POA: Diagnosis not present

## 2019-11-30 ENCOUNTER — Encounter: Payer: Self-pay | Admitting: Psychology
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
## 2020-01-12 ENCOUNTER — Ambulatory Visit: Payer: Medicare Other | Attending: Internal Medicine

## 2020-01-12 DIAGNOSIS — Z20822 Contact with and (suspected) exposure to covid-19: Secondary | ICD-10-CM

## 2020-01-13 ENCOUNTER — Other Ambulatory Visit: Payer: Self-pay

## 2020-01-13 LAB — NOVEL CORONAVIRUS, NAA: SARS-CoV-2, NAA: NOT DETECTED

## 2020-01-14 ENCOUNTER — Encounter: Payer: Self-pay | Admitting: Obstetrics and Gynecology

## 2020-01-14 ENCOUNTER — Ambulatory Visit: Payer: Medicare Other | Admitting: Obstetrics and Gynecology

## 2020-01-14 VITALS — BP 118/76 | Ht 64.0 in | Wt 148.0 lb

## 2020-01-14 DIAGNOSIS — Z01419 Encounter for gynecological examination (general) (routine) without abnormal findings: Secondary | ICD-10-CM | POA: Diagnosis not present

## 2020-01-14 DIAGNOSIS — N943 Premenstrual tension syndrome: Secondary | ICD-10-CM | POA: Diagnosis not present

## 2020-01-14 DIAGNOSIS — Z124 Encounter for screening for malignant neoplasm of cervix: Secondary | ICD-10-CM | POA: Diagnosis not present

## 2020-01-14 DIAGNOSIS — Z1151 Encounter for screening for human papillomavirus (HPV): Secondary | ICD-10-CM

## 2020-01-14 DIAGNOSIS — Z13220 Encounter for screening for lipoid disorders: Secondary | ICD-10-CM | POA: Diagnosis not present

## 2020-01-14 NOTE — Addendum Note (Signed)
Addended by: Dayna Barker on: 01/14/2020 03:05 PM   Modules accepted: Orders

## 2020-01-14 NOTE — Progress Notes (Signed)
Cristina Soto [birth year] [id number]  SUBJECTIVE:  49 y.o. G1P0010 female for annual routine gynecologic exam and Pap smear. She has no gynecologic concerns.  She does feel that in the 10 days or so leading to her.  She does feel particularly moody and has a lot of stress sensation, and this really lets go once her period begins and resumes again the following month about the same time.  Current Outpatient Medications  Medication Sig Dispense Refill  . Ibuprofen (ADVIL PO) Take by mouth.     No current facility-administered medications for this visit.   Allergies: Corticosteroids, Erythromycin, Promethazine hcl, and Promethazine hcl  Patient's last menstrual period was 12/17/2019.  Past medical history,surgical history, problem list, medications, allergies, family history and social history were all reviewed and documented as reviewed in the EPIC chart.  ROS:  Feeling well. No dyspnea or chest pain on exertion.  No abdominal pain, change in bowel habits, black or bloody stools.  No urinary tract symptoms. GYN ROS: normal menses, no abnormal bleeding, pelvic pain or discharge, no breast pain or new or enlarging lumps on self exam. No neurological complaints.    OBJECTIVE:  BP 118/76   Ht 5\' 4"  (1.626 m)   Wt 148 lb (67.1 kg)   LMP 12/17/2019   BMI 25.40 kg/m  The patient appears well, alert, oriented x 3, in no distress. ENT normal.  Neck supple. No cervical or supraclavicular adenopathy or thyromegaly.  Lungs are clear, good air entry, no wheezes, rhonchi or rales. S1 and S2 normal, no murmurs, regular rate and rhythm.  Abdomen soft without tenderness, guarding, mass or organomegaly.  Neurological is normal, no focal findings.  BREAST EXAM: breasts appear normal, no suspicious masses, no skin or nipple changes or axillary nodes  PELVIC EXAM: VULVA: normal appearing vulva with no masses, tenderness or lesions, VAGINA: normal appearing vagina with normal color and discharge, no  lesions, CERVIX: normal appearing cervix without discharge or lesions, UTERUS: uterus is normal size, shape, consistency and nontender, ADNEXA: normal adnexa in size, nontender and no masses, RECTAL: normal rectal, no masses, PAP: Pap smear done today, thin-prep method  Chaperone: Caryn Bee present during the examination  ASSESSMENT:  49 y.o. G1P0010 here for annual gynecologic exam  PLAN:   1.  Perimenopausal.  No concerns about excessive menstrual bleeding at this time.  Some concerns about PMS as described below. 2. Pap smear 2017.  No history of abnormal Pap smears.  Pap smear repeated today with HPV testing. 3. Mammogram 11/2017.  Normal breast exam today.  She is reminded to schedule an annual mammogram this year as this is overdue. 4. Colonoscopy recommended to start at age 17, next year for her. 5.  Premenstrual syndrome.  We discussed the importance of balanced diet and regular exercise/outlets for stress.  Medication treatments include oral contraceptive pills (she reports a history of migraines so this may not work for her), and SSRIs as second line.  He would like to just try conservative measures for the time being. 6.  Conception.  She had a few questions regarding prospect of conceiving.  Discussed issues that can arise with conception in this age range, if interested, we could check ovarian reserve testing if she desires at some point with a cycle day 3 FSH and/or AMH. 7. Health maintenance.  She recently ate so she will plan to return fasting another day for routine screening blood work (lipids, CBC, CMP).  Return annually or sooner, prn.  Cristina Soto  Cristina Bombard MD  01/14/20

## 2020-01-14 NOTE — Patient Instructions (Signed)
Please remember to schedule your annual mammogram this year. We will report back on your Pap smear and HPV test result

## 2020-01-18 LAB — PAP IG AND HPV HIGH-RISK: HPV DNA High Risk: NOT DETECTED

## 2020-01-28 ENCOUNTER — Ambulatory Visit: Payer: Medicare Other | Attending: Family

## 2020-01-28 ENCOUNTER — Other Ambulatory Visit: Payer: Self-pay

## 2020-01-28 DIAGNOSIS — Z20822 Contact with and (suspected) exposure to covid-19: Secondary | ICD-10-CM

## 2020-01-29 LAB — NOVEL CORONAVIRUS, NAA: SARS-CoV-2, NAA: NOT DETECTED

## 2020-02-07 ENCOUNTER — Telehealth: Payer: Self-pay | Admitting: *Deleted

## 2020-02-07 DIAGNOSIS — Z1321 Encounter for screening for nutritional disorder: Secondary | ICD-10-CM

## 2020-02-07 DIAGNOSIS — N951 Menopausal and female climacteric states: Secondary | ICD-10-CM

## 2020-02-07 NOTE — Telephone Encounter (Signed)
Yes, thank you.

## 2020-02-07 NOTE — Telephone Encounter (Signed)
Lab orders placed.  

## 2020-02-07 NOTE — Telephone Encounter (Signed)
Patient coming for annual labs tomorrow, requesting to have vitamin d and FSH level added to blood work. Okay?

## 2020-02-08 ENCOUNTER — Other Ambulatory Visit: Payer: Medicare Other

## 2020-02-08 ENCOUNTER — Other Ambulatory Visit: Payer: Self-pay

## 2020-02-08 DIAGNOSIS — Z13220 Encounter for screening for lipoid disorders: Secondary | ICD-10-CM

## 2020-02-08 DIAGNOSIS — N951 Menopausal and female climacteric states: Secondary | ICD-10-CM

## 2020-02-08 DIAGNOSIS — Z1321 Encounter for screening for nutritional disorder: Secondary | ICD-10-CM

## 2020-02-08 DIAGNOSIS — Z01419 Encounter for gynecological examination (general) (routine) without abnormal findings: Secondary | ICD-10-CM

## 2020-02-08 LAB — COMPREHENSIVE METABOLIC PANEL
AG Ratio: 1.4 (calc) (ref 1.0–2.5)
ALT: 9 U/L (ref 6–29)
AST: 16 U/L (ref 10–35)
Albumin: 4.1 g/dL (ref 3.6–5.1)
Alkaline phosphatase (APISO): 58 U/L (ref 31–125)
BUN: 10 mg/dL (ref 7–25)
CO2: 25 mmol/L (ref 20–32)
Calcium: 9.4 mg/dL (ref 8.6–10.2)
Chloride: 106 mmol/L (ref 98–110)
Creat: 0.66 mg/dL (ref 0.50–1.10)
Globulin: 2.9 g/dL (calc) (ref 1.9–3.7)
Glucose, Bld: 88 mg/dL (ref 65–99)
Potassium: 4.2 mmol/L (ref 3.5–5.3)
Sodium: 138 mmol/L (ref 135–146)
Total Bilirubin: 0.6 mg/dL (ref 0.2–1.2)
Total Protein: 7 g/dL (ref 6.1–8.1)

## 2020-02-08 LAB — LIPID PANEL
Cholesterol: 143 mg/dL (ref <200)
HDL: 37 mg/dL — ABNORMAL LOW (ref >=50)
LDL Cholesterol (Calc): 95 mg/dL (calc)
Non-HDL Cholesterol (Calc): 106 mg/dL (calc) (ref <130)
Total CHOL/HDL Ratio: 3.9 (calc) (ref <5.0)
Triglycerides: 37 mg/dL (ref <150)

## 2020-02-08 LAB — VITAMIN D 25 HYDROXY (VIT D DEFICIENCY, FRACTURES): Vit D, 25-Hydroxy: 14 ng/mL — ABNORMAL LOW (ref 30–100)

## 2020-02-08 LAB — CBC
HCT: 40.2 % (ref 35.0–45.0)
Hemoglobin: 13.1 g/dL (ref 11.7–15.5)
MCH: 28.4 pg (ref 27.0–33.0)
MCHC: 32.6 g/dL (ref 32.0–36.0)
MCV: 87 fL (ref 80.0–100.0)
MPV: 11.2 fL (ref 7.5–12.5)
Platelets: 258 10*3/uL (ref 140–400)
RBC: 4.62 10*6/uL (ref 3.80–5.10)
RDW: 13 % (ref 11.0–15.0)
WBC: 5.1 10*3/uL (ref 3.8–10.8)

## 2020-02-08 LAB — FOLLICLE STIMULATING HORMONE: FSH: 14.1 m[IU]/mL

## 2020-03-16 NOTE — Telephone Encounter (Signed)
Opened in error

## 2020-03-17 ENCOUNTER — Ambulatory Visit (INDEPENDENT_AMBULATORY_CARE_PROVIDER_SITE_OTHER): Payer: Medicare Other

## 2020-03-17 DIAGNOSIS — Z0000 Encounter for general adult medical examination without abnormal findings: Secondary | ICD-10-CM

## 2020-03-17 NOTE — Patient Instructions (Addendum)
  MEDICARE ANNUAL WELLNESS VISIT Health Maintenance Summary and Written Plan of Care  Ms. Nylen ,  Thank you for allowing me to perform your Medicare Annual Wellness Visit and for your ongoing commitment to your health.   Health Maintenance & Immunization History Health Maintenance  Topic Date Due  . HIV Screening  Never done  . TETANUS/TDAP  Never done  . INFLUENZA VACCINE  06/11/2020  . PAP SMEAR-Modifier  01/14/2023   Immunization History  Administered Date(s) Administered  . Influenza, Quadrivalent, Recombinant, Inj, Pf 12/17/2016, 09/25/2017, 12/14/2018    These are the patient goals that we discussed:  Increase physical activity to 150 minutes per week.  Limit salt and soda intake.  Schedule eye exam   This is a list of Health Maintenance Items that are overdue or due now: Health Maintenance Due  Topic Date Due  . HIV Screening  Never done  . TETANUS/TDAP  Never done     Orders/Referrals Placed Today: No orders of the defined types were placed in this encounter.  (Contact our referral department at [phone] if you have not spoken with someone about your referral appointment within the next 5 days)    Follow-up Plan  Scheduled with Dr. Louanne Skye 09/18/2020

## 2020-03-17 NOTE — Progress Notes (Signed)
MEDICARE ANNUAL WELLNESS VISIT  03/17/2020  Telephone Visit Disclaimer This Medicare AWV was conducted by telephone due to national recommendations for restrictions regarding the COVID-19 Pandemic (e.g. social distancing).  I verified, using two identifiers, that I am speaking with Cristina Soto or their authorized healthcare agent. I discussed the limitations, risks, security, and privacy concerns of performing an evaluation and management service by telephone and the potential availability of an in-person appointment in the future. The patient expressed understanding and agreed to proceed.   Subjective:  Cristina Soto is a 49 y.o. female patient of Dettinger, Elige Radon, MD who had a Medicare Annual Wellness Visit today via telephone. Cristina Soto is Disabled and lives alone in he own apartment in [place]. She stays with her mom and her sister about 4 days per week in [place] she has no children. she reports that she is socially active and does interact with friends/family regularly. she is minimally physically active and enjoys music.  Patient Care Team: Dettinger, Elige Radon, MD as PCP - General (Family Medicine)  Advanced Directives 03/17/2020 03/17/2019 06/18/2017  Does Patient Have a Medical Advance Directive? No No No  Would patient like information on creating a medical advance directive? No - Patient declined Yes (MAU/Ambulatory/Procedural Areas - Information given) Yes (ED - Information included in AVS)    Hospital Utilization Over the Past 12 Months: # of hospitalizations or ER visits: 0 # of surgeries: 0  Review of Systems    Patient reports that her overall health is unchanged compared to last year.   Patient Reported Readings (BP, Pulse, CBG, Weight, etc) none  Pain Assessment Pain : No/denies pain     Current Medications & Allergies (verified) Allergies as of 03/17/2020      Reactions   Corticosteroids Nausea And Vomiting   Erythromycin Nausea And Vomiting   Promethazine Hcl Nausea And Vomiting   Phenergan   Promethazine Hcl Nausea And Vomiting   Phenergan      Medication List       Accurate as of Mar 17, 2020  9:42 AM. If you have any questions, ask your nurse or doctor.        ADVIL PO Take by mouth.   aspirin-acetaminophen-caffeine 250-250-65 MG tablet Commonly known as: EXCEDRIN MIGRAINE Take 2 tablets by mouth every 6 (six) hours as needed for headache.       History (reviewed): Past Medical History:  Diagnosis Date  . Anxiety   . Asthma   . Chronic fatigue fibromyalgia syndrome   . CIN I (cervical intraepithelial neoplasia I) 2002  . Depression   . Fibromyalgia   . Hemorrhoids   . Migraine   . Vulvar pruritus    Past Surgical History:  Procedure Laterality Date  . COLPOSCOPY  2002  . DILATION AND CURETTAGE OF UTERUS  1997   TH.AB   Family History  Problem Relation Age of Onset  . Cancer Paternal Grandmother        Lung  . Cancer Paternal Grandfather        Liver  . Diverticulitis Father   . Diverticulitis Sister   . Alcohol abuse Sister   . Asthma Mother   . Colon cancer Neg Hx   . Breast cancer Neg Hx    Social History   Socioeconomic History  . Marital status: Single    Spouse name: Not on file  . Number of children: 0  . Years of education: Not on file  . Highest education  level: Bachelor's degree (e.g., BA, AB, BS)  Occupational History  . Not on file  Tobacco Use  . Smoking status: Never Smoker  . Smokeless tobacco: Never Used  Substance and Sexual Activity  . Alcohol use: Yes    Alcohol/week: 0.0 standard drinks    Comment: Occas wine  . Drug use: No  . Sexual activity: Never    Birth control/protection: Abstinence    Comment: 1st intercourse 49 yo-More than 5 partners  Other Topics Concern  . Not on file  Social History Narrative  . Not on file   Social Determinants of Health   Financial Resource Strain:   . Difficulty of Paying Living Expenses:   Food Insecurity:   .  Worried About Programme researcher, broadcasting/film/video in the Last Year:   . Barista in the Last Year:   Transportation Needs:   . Freight forwarder (Medical):   Marland Kitchen Lack of Transportation (Non-Medical):   Physical Activity:   . Days of Exercise per Week:   . Minutes of Exercise per Session:   Stress:   . Feeling of Stress :   Social Connections:   . Frequency of Communication with Friends and Family:   . Frequency of Social Gatherings with Friends and Family:   . Attends Religious Services:   . Active Member of Clubs or Organizations:   . Attends Banker Meetings:   Marland Kitchen Marital Status:     Activities of Daily Living In your present state of health, do you have any difficulty performing the following activities: 03/17/2020  Hearing? N  Vision? N  Difficulty concentrating or making decisions? N  Walking or climbing stairs? N  Dressing or bathing? N  Doing errands, shopping? N  Preparing Food and eating ? N  Using the Toilet? N  In the past six months, have you accidently leaked urine? N  Do you have problems with loss of bowel control? N  Managing your Medications? N  Managing your Finances? N  Housekeeping or managing your Housekeeping? N  Some recent data might be hidden    Patient Education/ Literacy How often do you need to have someone help you when you read instructions, pamphlets, or other written materials from your doctor or pharmacy?: 1 - Never What is the last grade level you completed in school?: BA  Exercise Current Exercise Habits: Home exercise routine, Time (Minutes): 20, Frequency (Times/Week): 3, Weekly Exercise (Minutes/Week): 60, Intensity: Mild, Exercise limited by: None identified  Diet Patient reports consuming 3 meals a day and 2 snack(s) a day Patient reports that her primary diet is: Regular Patient reports that she does have regular access to food.   Depression Screen PHQ 2/9 Scores 03/17/2020 03/17/2019 03/17/2019 10/09/2018 09/28/2018 05/07/2018  06/18/2017  PHQ - 2 Score 0 0 0 0 0 0 0     Fall Risk Fall Risk  03/17/2020 03/17/2019 10/09/2018 05/07/2018 06/18/2017  Falls in the past year? 0 0 0 No No  Follow up - Education provided;Falls prevention discussed - - -     Objective:  Cristina Soto seemed alert and oriented and she participated appropriately during our telephone visit.  Blood Pressure Weight BMI  BP Readings from Last 3 Encounters:  01/14/20 118/76  03/17/19 112/71  10/09/18 (!) 121/59   Wt Readings from Last 3 Encounters:  01/14/20 148 lb (67.1 kg)  03/17/19 141 lb (64 kg)  10/09/18 144 lb (65.3 kg)   BMI Readings from Last 1 Encounters:  01/14/20 25.40 kg/m    *Unable to obtain current vital signs, weight, and BMI due to telephone visit type  Hearing/Vision  . Via did not seem to have difficulty with hearing/understanding during the telephone conversation . Reports that she has not had a formal eye exam by an eye care professional within the past year . Reports that she has not had a formal hearing evaluation within the past year *Unable to fully assess hearing and vision during telephone visit type  Cognitive Function: 6CIT Screen 03/17/2020 03/17/2019  What Year? 0 points 0 points  What month? 0 points 0 points  What time? 0 points 0 points  Count back from 20 0 points 0 points  Months in reverse 0 points 0 points  Repeat phrase 0 points 0 points  Total Score 0 0   (Normal:0-7, Significant for Dysfunction: >8)  Normal Cognitive Function Screening: Yes   Immunization & Health Maintenance Record Immunization History  Administered Date(s) Administered  . Influenza, Quadrivalent, Recombinant, Inj, Pf 12/17/2016, 09/25/2017, 12/14/2018    Health Maintenance  Topic Date Due  . HIV Screening  Never done  . TETANUS/TDAP  Never done  . INFLUENZA VACCINE  06/11/2020  . PAP SMEAR-Modifier  01/14/2023       Assessment  This is a routine wellness examination for Cristina Soto.  Health  Maintenance: Due or Overdue Health Maintenance Due  Topic Date Due  . HIV Screening  Never done  . TETANUS/TDAP  Never done    Cristina Soto does not need a referral for Community Assistance: Care Management:   no Social Work:    no Prescription Assistance:  no Nutrition/Diabetes Education:  no   Plan:  Personalized Goals  Exercise 150 minutes per week  Limit salt and soda intake  Schedule eye exam  Personalized Health Maintenance & Screening Recommendations    Lung Cancer Screening Recommended: no (Low Dose CT Chest recommended if Age 57-80 years, 30 pack-year currently smoking OR have quit w/in past 15 years) Hepatitis C Screening recommended: no HIV Screening recommended: no  Advanced Directives: Written information was prepared per patient's request.  Referrals & Orders No orders of the defined types were placed in this encounter.   Follow-up Plan . Follow-up with Dettinger, Fransisca Kaufmann, MD as planned . Schedule 09/18/2020  I have personally reviewed and noted the following in the patient's chart:   . Medical and social history . Use of alcohol, tobacco or illicit drugs  . Current medications and supplements . Functional ability and status . Nutritional status . Physical activity . Advanced directives . List of other physicians . Hospitalizations, surgeries, and ER visits in previous 12 months . Vitals . Screenings to include cognitive, depression, and falls . Referrals and appointments  In addition, I have reviewed and discussed with Cristina Soto certain preventive protocols, quality metrics, and best practice recommendations. A written personalized care plan for preventive services as well as general preventive health recommendations is available and can be mailed to the patient at her request.      Maud Deed [facility]  11/18/5629

## 2020-03-21 ENCOUNTER — Other Ambulatory Visit: Payer: Self-pay

## 2020-03-21 ENCOUNTER — Encounter: Payer: Medicare Other | Attending: Psychology | Admitting: Psychology

## 2020-03-21 DIAGNOSIS — F419 Anxiety disorder, unspecified: Secondary | ICD-10-CM

## 2020-03-21 DIAGNOSIS — F064 Anxiety disorder due to known physiological condition: Secondary | ICD-10-CM | POA: Insufficient documentation

## 2020-03-21 DIAGNOSIS — G43001 Migraine without aura, not intractable, with status migrainosus: Secondary | ICD-10-CM | POA: Diagnosis not present

## 2020-03-21 DIAGNOSIS — F0630 Mood disorder due to known physiological condition, unspecified: Secondary | ICD-10-CM | POA: Diagnosis not present

## 2020-03-21 DIAGNOSIS — F39 Unspecified mood [affective] disorder: Secondary | ICD-10-CM | POA: Diagnosis not present

## 2020-03-22 ENCOUNTER — Encounter: Payer: Self-pay | Admitting: Psychology
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
## 2020-04-25 ENCOUNTER — Encounter: Payer: Self-pay | Admitting: Psychology

## 2020-04-25 ENCOUNTER — Encounter: Payer: Medicare Other | Attending: Psychology | Admitting: Psychology

## 2020-04-25 ENCOUNTER — Other Ambulatory Visit: Payer: Self-pay

## 2020-04-25 DIAGNOSIS — G43001 Migraine without aura, not intractable, with status migrainosus: Secondary | ICD-10-CM | POA: Diagnosis not present

## 2020-04-25 DIAGNOSIS — F0630 Mood disorder due to known physiological condition, unspecified: Secondary | ICD-10-CM

## 2020-04-25 DIAGNOSIS — F39 Unspecified mood [affective] disorder: Secondary | ICD-10-CM | POA: Insufficient documentation

## 2020-04-25 DIAGNOSIS — F064 Anxiety disorder due to known physiological condition: Secondary | ICD-10-CM
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
## 2020-05-04 ENCOUNTER — Encounter: Payer: Medicare Other | Admitting: Psychology

## 2020-05-04 ENCOUNTER — Other Ambulatory Visit: Payer: Self-pay

## 2020-05-04 DIAGNOSIS — F0630 Mood disorder due to known physiological condition, unspecified: Secondary | ICD-10-CM

## 2020-05-04 DIAGNOSIS — F064 Anxiety disorder due to known physiological condition: Secondary | ICD-10-CM

## 2020-05-04 DIAGNOSIS — G43001 Migraine without aura, not intractable, with status migrainosus: Secondary | ICD-10-CM | POA: Diagnosis not present

## 2020-05-04 DIAGNOSIS — F39 Unspecified mood [affective] disorder: Secondary | ICD-10-CM | POA: Diagnosis not present

## 2020-05-05 ENCOUNTER — Telehealth: Payer: Self-pay | Admitting: *Deleted

## 2020-05-05 NOTE — Telephone Encounter (Signed)
Unfortunately I do not know about that medication as I am not a headache specialist.  That kind of medication would typically prescribed by an internist or a neurologist.  Typically for migraines that are clearly menstrual cycle related we suggest hormonal contraceptives.

## 2020-05-05 NOTE — Telephone Encounter (Signed)
Patient informed. 

## 2020-05-05 NOTE — Telephone Encounter (Signed)
Patient called requesting medication to help manage menstrual migraines, reports she typically has migraine 5 days before cycle, during cycle and after cycle each month. She has tried Advil and Excedrin and it doesn't help, she asked your thoughts about Bernita Raisin? Please advise

## 2020-05-08 ENCOUNTER — Encounter: Payer: Self-pay | Admitting: Psychology
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
## 2020-07-06 ENCOUNTER — Encounter: Payer: Self-pay | Admitting: Psychology

## 2020-07-06 ENCOUNTER — Other Ambulatory Visit: Payer: Self-pay

## 2020-07-06 ENCOUNTER — Encounter: Payer: Medicare Other | Attending: Psychology | Admitting: Psychology

## 2020-07-06 DIAGNOSIS — F39 Unspecified mood [affective] disorder: Secondary | ICD-10-CM | POA: Insufficient documentation

## 2020-07-06 DIAGNOSIS — F0630 Mood disorder due to known physiological condition, unspecified: Secondary | ICD-10-CM

## 2020-07-06 DIAGNOSIS — G43001 Migraine without aura, not intractable, with status migrainosus: Secondary | ICD-10-CM

## 2020-07-06 DIAGNOSIS — F064 Anxiety disorder due to known physiological condition: Secondary | ICD-10-CM | POA: Insufficient documentation
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
## 2020-07-12 ENCOUNTER — Ambulatory Visit: Payer: Medicare Other | Admitting: Nurse Practitioner

## 2020-07-12 ENCOUNTER — Encounter: Payer: Self-pay | Admitting: Nurse Practitioner

## 2020-07-12 ENCOUNTER — Other Ambulatory Visit: Payer: Self-pay

## 2020-07-12 VITALS — BP 118/80

## 2020-07-12 DIAGNOSIS — R300 Dysuria: Secondary | ICD-10-CM | POA: Diagnosis not present

## 2020-07-12 DIAGNOSIS — N898 Other specified noninflammatory disorders of vagina: Secondary | ICD-10-CM

## 2020-07-12 DIAGNOSIS — N943 Premenstrual tension syndrome: Secondary | ICD-10-CM

## 2020-07-12 DIAGNOSIS — N951 Menopausal and female climacteric states: Secondary | ICD-10-CM

## 2020-07-12 DIAGNOSIS — B373 Candidiasis of vulva and vagina: Secondary | ICD-10-CM

## 2020-07-12 DIAGNOSIS — B3731 Acute candidiasis of vulva and vagina: Secondary | ICD-10-CM

## 2020-07-12 LAB — WET PREP FOR TRICH, YEAST, CLUE

## 2020-07-12 MED ORDER — FLUCONAZOLE 150 MG PO TABS: 150.0000 mg | ORAL_TABLET | ORAL | 0 refills | Status: DC

## 2020-07-12 NOTE — Progress Notes (Signed)
   Acute Office Visit  Subjective:    Patient ID: Cristina Soto, female    DOB: November 28, 1970, 49 y.o.   MRN: [id number]   HPI 49 y.o. presents today for vaginal irritation, itching, dryness, and discharge. She notices it most before and after her cycle. She is beginning to have cycle irregularity and occasional hot flashes. She also complains of worsening PMS symptoms and migraines around her cycle. Not currently sexually active.    Review of Systems  Constitutional: Negative.   Genitourinary: Positive for dysuria, vaginal discharge and vaginal pain (itching/irritation). Negative for frequency, hematuria and urgency.       Objective:    Physical Exam Constitutional:      Appearance: Normal appearance.  Genitourinary:    Vagina: Vaginal discharge (white, thick) and erythema present.     Comments: Mild erythema at introitus     BP 118/80 (BP Location: Right Arm, Patient Position: Sitting, Cuff Size: Normal)   LMP 06/11/2020  Wt Readings from Last 3 Encounters:  01/14/20 148 lb (67.1 kg)  03/17/19 141 lb (64 kg)  10/09/18 144 lb (65.3 kg)   Wet prep + yeast     Assessment & Plan:   Problem List Items Addressed This Visit    None    Visit Diagnoses    Vulvovaginal candidiasis    -  Primary   Relevant Medications   fluconazole (DIFLUCAN) 150 MG tablet   Vaginal irritation       Relevant Orders   WET PREP FOR TRICH, YEAST, CLUE   Dysuria       Relevant Orders   Urinalysis w microscopic + reflex cultur   PMS (premenstrual syndrome)       Perimenopause         Plan: Wet prep and exam consistent with vaginal yeast infection. Diflucan 150 mg today and repeat dose in 3 days. Most likely perimenopausal based on cycle changes and menopausal symptoms. We discussed treatment options to include wait and watch, over the counter supplement/herbals, and hormone therapy. Her migraines and mood changes have worsened around her cycle so she may benefit best from combination  hormone therapy to include pill, patch, or vaginal ring. She is interested in the patch and would like to do some more research on this and will let myself or Dr. [name] know if she would like to move forward with this.      Olivia Mackie [facility], 2:53 PM 07/12/2020

## 2020-07-12 NOTE — Patient Instructions (Signed)
Vaginal Yeast Infection, Adult  Vaginal yeast infection is a condition that causes vaginal discharge as well as soreness, swelling, and redness (inflammation) of the vagina. This is a common condition. Some women get this infection frequently. What are the causes? This condition is caused by a change in the normal balance of the yeast (candida) and bacteria that live in the vagina. This change causes an overgrowth of yeast, which causes the inflammation. What increases the risk? The condition is more likely to develop in women who:  Take antibiotic medicines.  Have diabetes.  Take birth control pills.  Are pregnant.  Douche often.  Have a weak body defense system (immune system).  Have been taking steroid medicines for a long time.  Frequently wear tight clothing. What are the signs or symptoms? Symptoms of this condition include:  White, thick, creamy vaginal discharge.  Swelling, itching, redness, and irritation of the vagina. The lips of the vagina (vulva) may be affected as well.  Pain or a burning feeling while urinating.  Pain during sex. How is this diagnosed? This condition is diagnosed based on:  Your medical history.  A physical exam.  A pelvic exam. Your health care provider will examine a sample of your vaginal discharge under a microscope. Your health care provider may send this sample for testing to confirm the diagnosis. How is this treated? This condition is treated with medicine. Medicines may be over-the-counter or prescription. You may be told to use one or more of the following:  Medicine that is taken by mouth (orally).  Medicine that is applied as a cream (topically).  Medicine that is inserted directly into the vagina (suppository). Follow these instructions at home:  Lifestyle  Do not have sex until your health care provider approves. Tell your sex partner that you have a yeast infection. That person should go to his or her health care  provider and ask if they should also be treated.  Do not wear tight clothes, such as pantyhose or tight pants.  Wear breathable cotton underwear. General instructions  Take or apply over-the-counter and prescription medicines only as told by your health care provider.  Eat more yogurt. This may help to keep your yeast infection from returning.  Do not use tampons until your health care provider approves.  Try taking a sitz bath to help with discomfort. This is a warm water bath that is taken while you are sitting down. The water should only come up to your hips and should cover your buttocks. Do this 3-4 times per day or as told by your health care provider.  Do not douche.  If you have diabetes, keep your blood sugar levels under control.  Keep all follow-up visits as told by your health care provider. This is important. Contact a health care provider if:  You have a fever.  Your symptoms go away and then return.  Your symptoms do not get better with treatment.  Your symptoms get worse.  You have new symptoms.  You develop blisters in or around your vagina.  You have blood coming from your vagina and it is not your menstrual period.  You develop pain in your abdomen. Summary  Vaginal yeast infection is a condition that causes discharge as well as soreness, swelling, and redness (inflammation) of the vagina.  This condition is treated with medicine. Medicines may be over-the-counter or prescription.  Take or apply over-the-counter and prescription medicines only as told by your health care provider.  Do not douche.   Do not have sex or use tampons until your health care provider approves.  Contact a health care provider if your symptoms do not get better with treatment or your symptoms go away and then return. This information is not intended to replace advice given to you by your health care provider. Make sure you discuss any questions you have with your health care  provider. Document Revised: 05/28/2019 Document Reviewed: 03/16/2018 Elsevier Patient Education  2020 Elsevier Inc[employer]  

## 2020-07-13 LAB — URINALYSIS W MICROSCOPIC + REFLEX CULTURE
Bacteria, UA: NONE SEEN /HPF
Bilirubin Urine: NEGATIVE
Glucose, UA: NEGATIVE
Hgb urine dipstick: NEGATIVE
Hyaline Cast: NONE SEEN /LPF
Ketones, ur: NEGATIVE
Leukocyte Esterase: NEGATIVE
Nitrites, Initial: NEGATIVE
Protein, ur: NEGATIVE
RBC / HPF: NONE SEEN /HPF (ref 0–2)
Specific Gravity, Urine: 1.014 (ref 1.001–1.03)
Squamous Epithelial / HPF: NONE SEEN /HPF (ref <=5)
WBC, UA: NONE SEEN /HPF (ref 0–5)
pH: 7.5 (ref 5.0–8.0)

## 2020-07-13 LAB — NO CULTURE INDICATED

## 2020-07-24 ENCOUNTER — Telehealth: Payer: Self-pay | Admitting: *Deleted

## 2020-07-24 ENCOUNTER — Other Ambulatory Visit: Payer: Self-pay | Admitting: Nurse Practitioner

## 2020-07-24 DIAGNOSIS — G43831 Menstrual migraine, intractable, with status migrainosus: Secondary | ICD-10-CM

## 2020-07-24 MED ORDER — ESTRADIOL 0.025 MG/24HR TD PTTW: 1.0000 | MEDICATED_PATCH | TRANSDERMAL | 3 refills | Status: DC

## 2020-07-24 MED ORDER — NORELGESTROMIN-ETH ESTRADIOL 150-35 MCG/24HR TD PTWK: 1.0000 | MEDICATED_PATCH | TRANSDERMAL | 11 refills | Status: DC

## 2020-07-24 NOTE — Telephone Encounter (Signed)
I called and discussed with patient below with patient and told her I am not aware of functional medicine doctor and I would proceed with [facility] as mentioned by [facility]. Patient verbalized she understood.

## 2020-07-24 NOTE — Telephone Encounter (Signed)
I will resend with twice weekly instructions. Thank you

## 2020-07-24 NOTE — Telephone Encounter (Signed)
-----   Message from Olivia Mackie, NP sent at 07/24/2020 10:12 AM EDT ----- She is interested in a functional medicine doctor for possible hormone regulation in the future. If you know of any will you send them to her? I mentioned [facility] but I'm not sure if they are considered functional medicine. Thank you

## 2020-07-24 NOTE — Progress Notes (Signed)
Patient experiences severe migraines around menstrual cycles sometimes lasting around a week. Continuous combination hormone patch agreed upon for hormone regulation and prevention of menstrual migraines. She is aware of the risks for blood clots, heart attack, stroke, and breast cancer.

## 2020-07-24 NOTE — Telephone Encounter (Signed)
Pharmacy called regarding estradiol 0.025mg  patch twice weekly, pharmacist said this patch is twice week and would recommend if patient will be using during cycles to use patch as twice that week verses 1 patch for the full week of cycles. Please advise

## 2020-07-24 NOTE — Telephone Encounter (Signed)
Called patient to discusse alternative option for management of menstrual migraines. She experiences these during menses and they last up to a week at times. She wants the lowest possible dose of hormones available for management of headaches. She is agreeable to try Vivelle-dot 0.025 mg/24 hour patch applied during the week of her menses.

## 2020-07-27 ENCOUNTER — Encounter: Payer: Medicare Other | Attending: Psychology | Admitting: Psychology

## 2020-07-27 ENCOUNTER — Other Ambulatory Visit: Payer: Self-pay

## 2020-07-27 ENCOUNTER — Encounter: Payer: Self-pay | Admitting: Psychology

## 2020-07-27 DIAGNOSIS — F064 Anxiety disorder due to known physiological condition: Secondary | ICD-10-CM | POA: Diagnosis not present

## 2020-07-27 DIAGNOSIS — F0630 Mood disorder due to known physiological condition, unspecified: Secondary | ICD-10-CM | POA: Diagnosis not present

## 2020-07-27 DIAGNOSIS — G43001 Migraine without aura, not intractable, with status migrainosus: Secondary | ICD-10-CM | POA: Diagnosis not present

## 2020-07-27 DIAGNOSIS — F39 Unspecified mood [affective] disorder: Secondary | ICD-10-CM | POA: Insufficient documentation
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
## 2020-08-14 ENCOUNTER — Other Ambulatory Visit: Payer: Medicare Other

## 2020-08-14 DIAGNOSIS — Z20822 Contact with and (suspected) exposure to covid-19: Secondary | ICD-10-CM

## 2020-08-15 LAB — SARS-COV-2, NAA 2 DAY TAT

## 2020-08-15 LAB — NOVEL CORONAVIRUS, NAA: SARS-CoV-2, NAA: NOT DETECTED

## 2020-08-16 ENCOUNTER — Ambulatory Visit (INDEPENDENT_AMBULATORY_CARE_PROVIDER_SITE_OTHER): Payer: Medicare Other | Admitting: Family Medicine

## 2020-08-16 ENCOUNTER — Encounter: Payer: Self-pay | Admitting: Family Medicine

## 2020-08-16 DIAGNOSIS — T63301A Toxic effect of unspecified spider venom, accidental (unintentional), initial encounter: Secondary | ICD-10-CM

## 2020-08-16 DIAGNOSIS — Z9109 Other allergy status, other than to drugs and biological substances: Secondary | ICD-10-CM | POA: Diagnosis not present

## 2020-08-16 DIAGNOSIS — S50862A Insect bite (nonvenomous) of left forearm, initial encounter: Secondary | ICD-10-CM

## 2020-08-16 MED ORDER — CEPHALEXIN 250 MG PO CAPS: 250.0000 mg | ORAL_CAPSULE | Freq: Four times a day (QID) | ORAL | 0 refills | Status: AC

## 2020-08-16 MED ORDER — MUPIROCIN 2 % EX OINT: 1.0000 "application " | TOPICAL_OINTMENT | Freq: Two times a day (BID) | CUTANEOUS | 0 refills | Status: DC

## 2020-08-16 NOTE — Progress Notes (Signed)
Virtual Visit via Telephone Note  I connected with Cristina Soto on 08/16/20 at 9:21 AM by telephone and verified that I am speaking with the correct person using two identifiers. Cristina Soto is currently located in her car and nobody is currently with her during this visit. The provider, Gwenlyn Fudge, FNP is located in their home at time of visit.  I discussed the limitations, risks, security and privacy concerns of performing an evaluation and management service by telephone and the availability of in person appointments. I also discussed with the patient that there may be a patient responsible charge related to this service. The patient expressed understanding and agreed to proceed.  Subjective: PCP: Dettinger, Elige Radon, MD  Chief Complaint  Patient presents with  . URI  . Insect Bite   Patient reports she was cleaning at her parents house when she felt her respiratory symptoms come on very suddenly.  Patient complains of runny nose, nasal congestion, sore throat, and pain/pressure in her face.  Her symptoms started 2 nights ago.  Denies any fever.  Claritin and Flonase improved her symptoms.  Patient has had 3 negative COVID-19 tests since the onset of her symptoms.  Patient has also been experiencing some nausea that she feels is related to a spider bite on her left arm.  She describes the bite as ulcerated with redness and swelling.  Denies any warmth.  She states there was only drainage on Sunday.  She has been applying Neosporin and keeping it clean, as well as applying purification and thieves on it to draw out what ever is in there.   ROS: Per HPI  Current Outpatient Medications:  .  aspirin-acetaminophen-caffeine (EXCEDRIN MIGRAINE) 250-250-65 MG tablet, Take 2 tablets by mouth every 6 (six) hours as needed for headache., Disp: , Rfl:  .  estradiol (VIVELLE-DOT) 0.025 MG/24HR, Place 1 patch onto the skin 2 (two) times a week. During week of menses., Disp: 8 patch,  Rfl: 3 .  fluconazole (DIFLUCAN) 150 MG tablet, Take 1 tablet (150 mg total) by mouth every 3 (three) days., Disp: 2 tablet, Rfl: 0 .  Ibuprofen (ADVIL PO), Take by mouth., Disp: , Rfl:   Allergies  Allergen Reactions  . Corticosteroids Nausea And Vomiting  . Erythromycin Nausea And Vomiting  . Promethazine Hcl Nausea And Vomiting    Phenergan  . Promethazine Hcl Nausea And Vomiting    Phenergan   Past Medical History:  Diagnosis Date  . Anxiety   . Asthma   . Chronic fatigue fibromyalgia syndrome   . CIN I (cervical intraepithelial neoplasia I) 2002  . Depression   . Fibromyalgia   . Hemorrhoids   . Migraine   . Vulvar pruritus     Observations/Objective: A&O  No respiratory distress or wheezing audible over the phone Mood, judgement, and thought processes all WNL  Assessment and Plan: 1. Spider bite wound, accidental or unintentional, initial encounter - Patient was able to send a picture of the bite via MyChart.  I advised that she stop using Neosporin and start using Bactroban.  She will do this twice daily.  If the bite worsened over the weekend she will pick up the prescription for Keflex and start it. - cephALEXin (KEFLEX) 250 MG capsule; Take 1 capsule (250 mg total) by mouth 4 (four) times daily for 7 days.  Dispense: 28 capsule; Refill: 0 - mupirocin ointment (BACTROBAN) 2 %; Apply 1 application topically 2 (two) times daily.  Dispense: 30 g;  Refill: 0  2. Environmental allergies - Allergy symptoms resolved with antihistamine and Flonase.   Follow Up Instructions:  I discussed the assessment and treatment plan with the patient. The patient was provided an opportunity to ask questions and all were answered. The patient agreed with the plan and demonstrated an understanding of the instructions.   The patient was advised to call back or seek an in-person evaluation if the symptoms worsen or if the condition fails to improve as anticipated.  The above assessment  and management plan was discussed with the patient. The patient verbalized understanding of and has agreed to the management plan. Patient is aware to call the clinic if symptoms persist or worsen. Patient is aware when to return to the clinic for a follow-up visit. Patient educated on when it is appropriate to go to the emergency department.   Time call ended: 9:38 AM  I provided 19 minutes of non-face-to-face time during this encounter.  Deliah Boston, MSN, APRN, FNP-C [facility] 08/16/20

## 2020-08-22 ENCOUNTER — Encounter: Payer: Self-pay | Admitting: Family Medicine

## 2020-08-24 ENCOUNTER — Other Ambulatory Visit: Payer: Self-pay

## 2020-08-24 ENCOUNTER — Encounter: Payer: Medicare Other | Attending: Psychology | Admitting: Psychology

## 2020-08-24 DIAGNOSIS — G43001 Migraine without aura, not intractable, with status migrainosus: Secondary | ICD-10-CM | POA: Insufficient documentation

## 2020-08-24 DIAGNOSIS — F064 Anxiety disorder due to known physiological condition: Secondary | ICD-10-CM | POA: Insufficient documentation

## 2020-08-24 DIAGNOSIS — F0630 Mood disorder due to known physiological condition, unspecified: Secondary | ICD-10-CM

## 2020-09-11 ENCOUNTER — Encounter: Payer: Self-pay | Admitting: Psychology
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
## 2020-09-18 ENCOUNTER — Ambulatory Visit: Payer: Medicare Other | Admitting: Family Medicine

## 2020-09-19 ENCOUNTER — Other Ambulatory Visit: Payer: Self-pay

## 2020-09-19 ENCOUNTER — Encounter: Payer: Medicare Other | Attending: Psychology | Admitting: Psychology

## 2020-09-19 DIAGNOSIS — F064 Anxiety disorder due to known physiological condition: Secondary | ICD-10-CM

## 2020-09-19 DIAGNOSIS — G43001 Migraine without aura, not intractable, with status migrainosus: Secondary | ICD-10-CM | POA: Diagnosis not present

## 2020-09-19 DIAGNOSIS — F0630 Mood disorder due to known physiological condition, unspecified: Secondary | ICD-10-CM | POA: Diagnosis present

## 2020-09-20 ENCOUNTER — Encounter: Payer: Self-pay | Admitting: Psychology
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
## 2020-10-16 ENCOUNTER — Telehealth: Payer: Self-pay | Admitting: Nurse Practitioner

## 2020-10-16 NOTE — Telephone Encounter (Signed)
Attempted to call to discuss checking hormone levels/hormone management. No answer.

## 2020-10-17 ENCOUNTER — Telehealth: Payer: Self-pay | Admitting: Nurse Practitioner

## 2020-10-17 DIAGNOSIS — R4586 Emotional lability: Secondary | ICD-10-CM

## 2020-10-17 DIAGNOSIS — N912 Amenorrhea, unspecified: Secondary | ICD-10-CM

## 2020-10-17 NOTE — Telephone Encounter (Signed)
Spoke with patient regarding cycle irregularity and PMS symptoms most likely related to perimenopause. She missed a cycle a couple of months and feels she is constantly PMSing. She will schedule lab appointment to check hormone levels and depending on results we will discuss treatment options verus functional medicine referral per request. She is agreeable to plan.

## 2020-10-24 ENCOUNTER — Other Ambulatory Visit: Payer: Medicare Other

## 2020-10-24 ENCOUNTER — Other Ambulatory Visit: Payer: Self-pay

## 2020-10-24 DIAGNOSIS — N912 Amenorrhea, unspecified: Secondary | ICD-10-CM

## 2020-10-24 DIAGNOSIS — R4586 Emotional lability: Secondary | ICD-10-CM

## 2020-10-24 NOTE — Telephone Encounter (Signed)
You have ordered her labs. She does need a lab appointment scheduled to come in for them.

## 2020-10-25 ENCOUNTER — Encounter: Payer: Medicare Other | Admitting: Family Medicine

## 2020-10-25 ENCOUNTER — Ambulatory Visit: Payer: Medicare Other | Admitting: Family Medicine

## 2020-10-26 ENCOUNTER — Encounter: Payer: Self-pay | Admitting: Family Medicine

## 2020-10-29 LAB — TESTOS,TOTAL,FREE AND SHBG (FEMALE)
Free Testosterone: 2.3 pg/mL (ref 0.1–6.4)
Sex Hormone Binding: 35 nmol/L (ref 17–124)
Testosterone, Total, LC-MS-MS: 21 ng/dL (ref 2–45)

## 2020-10-29 LAB — TSH: TSH: 3.1 mIU/L

## 2020-10-29 LAB — FOLLICLE STIMULATING HORMONE: FSH: 21.2 m[IU]/mL

## 2020-10-29 LAB — ESTROGENS, TOTAL: Estrogen: 186.4 pg/mL

## 2020-10-29 LAB — PROGESTERONE: Progesterone: 1.4 ng/mL

## 2020-10-29 LAB — LUTEINIZING HORMONE: LH: 9.3 m[IU]/mL

## 2020-10-30 ENCOUNTER — Telehealth: Payer: Self-pay | Admitting: Nurse Practitioner

## 2020-10-30 NOTE — Telephone Encounter (Signed)
Called patient to discuss lab results and reassurance provided on normal findings.  She admits that her anxiety has been very high lately and thinks some of her symptoms may be stemming from this.  She is having irregular cycles with her most recent being a few days ago.  She becomes very emotional a few days before menses.  We discussed treatment options to include OTC supplements versus hormone therapy as well as medications for anxiety.  At this time she wants to avoid HRT and anti-anxiety medication and prefers to use more natural options.  Gave recommendations for OTC supplements such as Estroven and black cohosh plus vitamin E for PMS/perimenopausal symptoms and Ashwaganda for mood.  She will keep me updated on how she is doing.  All questions answered.

## 2020-11-09 ENCOUNTER — Ambulatory Visit: Payer: Medicare Other | Attending: Internal Medicine

## 2020-11-09 DIAGNOSIS — Z23 Encounter for immunization: Secondary | ICD-10-CM

## 2020-11-09 NOTE — Progress Notes (Signed)
   Covid-19 Vaccination Clinic  Name:  Cristina Soto    MRN: [id number] DOB: Jul 29, 1971  11/09/2020  Cristina Soto was observed post Covid-19 immunization for 30 minutes based on pre-vaccination screening without incident. She was provided with Vaccine Information Sheet and instruction to access the V-Safe system.   Cristina Soto was instructed to call 911 with any severe reactions post vaccine: Marland Kitchen Difficulty breathing  . Swelling of face and throat  . A fast heartbeat  . A bad rash all over body  . Dizziness and weakness   Immunizations Administered    Name Date Dose VIS Date Route   Pfizer COVID-19 Vaccine 11/09/2020  2:02 PM 0.3 mL 08/30/2020 Intramuscular   Manufacturer: [employer], [employer]   Lot: [id number]   NDC: [address]-1

## 2020-11-29 ENCOUNTER — Ambulatory Visit: Payer: Medicare Other

## 2020-12-12 ENCOUNTER — Encounter: Payer: Medicare Other | Attending: Psychology | Admitting: Psychology

## 2020-12-12 ENCOUNTER — Other Ambulatory Visit: Payer: Self-pay

## 2020-12-12 DIAGNOSIS — G43001 Migraine without aura, not intractable, with status migrainosus: Secondary | ICD-10-CM | POA: Diagnosis not present

## 2020-12-12 DIAGNOSIS — F0630 Mood disorder due to known physiological condition, unspecified: Secondary | ICD-10-CM | POA: Diagnosis present

## 2020-12-12 DIAGNOSIS — F064 Anxiety disorder due to known physiological condition: Secondary | ICD-10-CM | POA: Insufficient documentation

## 2020-12-15 ENCOUNTER — Encounter: Payer: Self-pay | Admitting: Nurse Practitioner

## 2020-12-17 ENCOUNTER — Encounter: Payer: Self-pay | Admitting: Psychology
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
## 2020-12-18 NOTE — Telephone Encounter (Signed)
Tiffany just a heads up I am not familiar with the type of doctor she is requesting.

## 2020-12-18 NOTE — Telephone Encounter (Signed)
I wouldn't think she would need a referral but I recommend Ferdinand Cava, MD with [facility] Functional [facility] here in [place]. If she is looking more specifically for hormone testing/replacement therapy [facility] specializes in this.

## 2021-01-09 ENCOUNTER — Encounter: Payer: Medicare Other | Attending: Psychology | Admitting: Psychology

## 2021-01-09 ENCOUNTER — Other Ambulatory Visit: Payer: Self-pay

## 2021-01-09 DIAGNOSIS — F0630 Mood disorder due to known physiological condition, unspecified: Secondary | ICD-10-CM | POA: Insufficient documentation

## 2021-01-09 DIAGNOSIS — F064 Anxiety disorder due to known physiological condition: Secondary | ICD-10-CM | POA: Diagnosis not present

## 2021-01-09 DIAGNOSIS — G43001 Migraine without aura, not intractable, with status migrainosus: Secondary | ICD-10-CM

## 2021-01-19 ENCOUNTER — Encounter: Payer: Self-pay | Admitting: Psychology
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
## 2021-01-29 ENCOUNTER — Encounter: Payer: Self-pay | Admitting: Family Medicine

## 2021-02-12 ENCOUNTER — Other Ambulatory Visit: Payer: Self-pay

## 2021-02-12 ENCOUNTER — Encounter: Payer: Medicare Other | Attending: Psychology | Admitting: Psychology

## 2021-02-12 DIAGNOSIS — G43001 Migraine without aura, not intractable, with status migrainosus: Secondary | ICD-10-CM

## 2021-02-12 DIAGNOSIS — F0630 Mood disorder due to known physiological condition, unspecified: Secondary | ICD-10-CM

## 2021-02-12 DIAGNOSIS — F064 Anxiety disorder due to known physiological condition: Secondary | ICD-10-CM

## 2021-02-18 ENCOUNTER — Encounter: Payer: Self-pay | Admitting: Psychology
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
## 2021-02-26 ENCOUNTER — Ambulatory Visit: Payer: Medicare Other | Admitting: Family Medicine

## 2021-03-05 ENCOUNTER — Encounter: Payer: Self-pay | Admitting: Nurse Practitioner

## 2021-04-02 ENCOUNTER — Telehealth: Payer: Self-pay

## 2021-04-02 ENCOUNTER — Telehealth: Payer: Self-pay | Admitting: Nurse Practitioner

## 2021-04-02 NOTE — Telephone Encounter (Signed)
Spoke with patient about her symptoms of anxiety and PMS. She has started OTC supplements for perimenopausal symptoms with good improvement but she has been having LLQ abdominal pain. After discussion we both agree she should be seen for further evaluation.

## 2021-04-02 NOTE — Telephone Encounter (Signed)
Cristina Soto , called this morning asking for Dr.Rodenbough to call her back she didn't want to specify for what she just said if you could return her call as soon as you can her phone number is [phone]

## 2021-04-03 ENCOUNTER — Encounter: Payer: Self-pay | Admitting: Nurse Practitioner

## 2021-04-03 ENCOUNTER — Other Ambulatory Visit: Payer: Self-pay

## 2021-04-03 ENCOUNTER — Ambulatory Visit: Payer: Medicare Other | Admitting: Nurse Practitioner

## 2021-04-03 VITALS — BP 118/74

## 2021-04-03 DIAGNOSIS — N926 Irregular menstruation, unspecified: Secondary | ICD-10-CM | POA: Diagnosis not present

## 2021-04-03 DIAGNOSIS — R1032 Left lower quadrant pain: Secondary | ICD-10-CM

## 2021-04-03 DIAGNOSIS — R5383 Other fatigue: Secondary | ICD-10-CM | POA: Diagnosis not present

## 2021-04-03 DIAGNOSIS — R140 Abdominal distension (gaseous): Secondary | ICD-10-CM | POA: Diagnosis not present

## 2021-04-03 NOTE — Progress Notes (Signed)
   Acute Office Visit  Subjective:    Patient ID: Cristina Soto, female    DOB: 1971-02-12, 50 y.o.   MRN: [id number]   HPI 50 y.o. presents today for LLQ abdominal pain that started a couple of years ago. The pain is intermittent and seems to occur before and during menses. It is sharp, nothing makes it better, and nothing makes it worse. Associated symptoms include abdominal bloating. She denies any changes in bowel habits. She has also been experiencing anxiety, hot flashes, and irregular menses. We have had long discussions regarding perimenopause and what to expect and treatment options. She is not interested in HRT at this time but has started OTC Amberen with some improvement in hot flashes. She has also had 2 normal cycles since starting Amberen. She has been considering seeing a functional medicine provider. [facility] 10/2020 was 21.2, normal TSH and female hormone panel at that time as well. She has had a lot of personal stress lately as well.   Review of Systems  Constitutional: Positive for fatigue.  Gastrointestinal: Positive for abdominal pain. Negative for constipation, diarrhea, nausea and vomiting.  Endocrine: Positive for heat intolerance.  Genitourinary: Negative.   Psychiatric/Behavioral: The patient is nervous/anxious.        Objective:    Physical Exam Constitutional:      Appearance: Normal appearance.  Genitourinary:    General: Normal vulva.     Vagina: Normal.     Cervix: Normal.     Uterus: Normal.      BP 118/74   LMP 03/25/2021  Wt Readings from Last 3 Encounters:  01/14/20 148 lb (67.1 kg)  03/17/19 141 lb (64 kg)  10/09/18 144 lb (65.3 kg)        Assessment & Plan:   Problem List Items Addressed This Visit   None   Visit Diagnoses    Left lower quadrant abdominal pain    -  Primary   Relevant Orders   US PELVIS TRANSVAGINAL NON-OB (TV ONLY)   Fatigue, unspecified type       Relevant Orders   CBC with Differential/Platelet    Comprehensive metabolic panel   Vitamin B12   VITAMIN D 25 Hydroxy (Vit-D Deficiency, Fractures)   Irregular menses       Relevant Orders   US PELVIS TRANSVAGINAL NON-OB (TV ONLY)     Plan: Pelvic ultrasound for evaluation of LLQ pain, abdominal bloating, and irregular menses. CBC, CMP, B12, and Vitamin D for evaluation of fatigue. Recommend continuing Amberen for perimenopausal symptoms. We again discussed perimenopause, what to expect, and treatment options. She is agreeable to plan.      Olivia Mackie DNP, 10:05 AM 04/03/2021

## 2021-04-04 ENCOUNTER — Other Ambulatory Visit: Payer: Medicare Other

## 2021-04-05 ENCOUNTER — Other Ambulatory Visit: Payer: Self-pay | Admitting: Nurse Practitioner

## 2021-04-05 DIAGNOSIS — E559 Vitamin D deficiency, unspecified: Secondary | ICD-10-CM

## 2021-04-05 LAB — COMPREHENSIVE METABOLIC PANEL
AG Ratio: 1.8 (calc) (ref 1.0–2.5)
ALT: 11 U/L (ref 6–29)
AST: 15 U/L (ref 10–35)
Albumin: 4.2 g/dL (ref 3.6–5.1)
Alkaline phosphatase (APISO): 56 U/L (ref 37–153)
BUN: 10 mg/dL (ref 7–25)
CO2: 26 mmol/L (ref 20–32)
Calcium: 9.2 mg/dL (ref 8.6–10.4)
Chloride: 107 mmol/L (ref 98–110)
Creat: 0.68 mg/dL (ref 0.50–1.05)
Globulin: 2.4 g/dL (calc) (ref 1.9–3.7)
Glucose, Bld: 90 mg/dL (ref 65–99)
Potassium: 4.2 mmol/L (ref 3.5–5.3)
Sodium: 140 mmol/L (ref 135–146)
Total Bilirubin: 0.5 mg/dL (ref 0.2–1.2)
Total Protein: 6.6 g/dL (ref 6.1–8.1)

## 2021-04-05 LAB — CBC WITH DIFFERENTIAL/PLATELET
Absolute Monocytes: 432 cells/uL (ref 200–950)
Basophils Absolute: 71 cells/uL (ref 0–200)
Basophils Relative: 1.5 %
Eosinophils Absolute: 61 cells/uL (ref 15–500)
Eosinophils Relative: 1.3 %
HCT: 38.8 % (ref 35.0–45.0)
Hemoglobin: 12.6 g/dL (ref 11.7–15.5)
Lymphs Abs: 1081 cells/uL (ref 850–3900)
MCH: 28.8 pg (ref 27.0–33.0)
MCHC: 32.5 g/dL (ref 32.0–36.0)
MCV: 88.6 fL (ref 80.0–100.0)
MPV: 11.4 fL (ref 7.5–12.5)
Monocytes Relative: 9.2 %
Neutro Abs: 3055 cells/uL (ref 1500–7800)
Neutrophils Relative %: 65 %
Platelets: 248 10*3/uL (ref 140–400)
RBC: 4.38 10*6/uL (ref 3.80–5.10)
RDW: 12.5 % (ref 11.0–15.0)
Total Lymphocyte: 23 %
WBC: 4.7 10*3/uL (ref 3.8–10.8)

## 2021-04-05 LAB — VITAMIN D 25 HYDROXY (VIT D DEFICIENCY, FRACTURES): Vit D, 25-Hydroxy: 20 ng/mL — ABNORMAL LOW (ref 30–100)

## 2021-04-05 LAB — VITAMIN B12: Vitamin B-12: 283 pg/mL (ref 200–1100)

## 2021-04-05 MED ORDER — VITAMIN D (ERGOCALCIFEROL) 1.25 MG (50000 UNIT) PO CAPS: 50000.0000 [IU] | ORAL_CAPSULE | ORAL | 0 refills | Status: AC

## 2021-04-24 ENCOUNTER — Encounter: Payer: Self-pay | Admitting: Family Medicine

## 2021-04-26 ENCOUNTER — Other Ambulatory Visit: Payer: Self-pay

## 2021-04-26 ENCOUNTER — Encounter: Payer: Medicare Other | Attending: Psychology | Admitting: Psychology

## 2021-04-26 DIAGNOSIS — G43001 Migraine without aura, not intractable, with status migrainosus: Secondary | ICD-10-CM | POA: Insufficient documentation

## 2021-04-26 DIAGNOSIS — F064 Anxiety disorder due to known physiological condition: Secondary | ICD-10-CM | POA: Diagnosis not present

## 2021-04-26 DIAGNOSIS — F0630 Mood disorder due to known physiological condition, unspecified: Secondary | ICD-10-CM | POA: Diagnosis not present

## 2021-05-01 ENCOUNTER — Other Ambulatory Visit: Payer: Medicare Other

## 2021-05-01 ENCOUNTER — Other Ambulatory Visit: Payer: Medicare Other | Admitting: Obstetrics & Gynecology

## 2021-05-10 ENCOUNTER — Other Ambulatory Visit: Payer: Medicare Other

## 2021-05-10 ENCOUNTER — Other Ambulatory Visit: Payer: Medicare Other | Admitting: Obstetrics & Gynecology

## 2021-05-19 ENCOUNTER — Encounter: Payer: Self-pay | Admitting: Nurse Practitioner

## 2021-05-31 ENCOUNTER — Other Ambulatory Visit: Payer: Medicare Other

## 2021-05-31 ENCOUNTER — Other Ambulatory Visit: Payer: Medicare Other | Admitting: Obstetrics & Gynecology

## 2021-05-31 DIAGNOSIS — Z0289 Encounter for other administrative examinations: Secondary | ICD-10-CM
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
## 2021-06-07 ENCOUNTER — Encounter: Payer: Self-pay | Admitting: Obstetrics & Gynecology

## 2021-06-07 ENCOUNTER — Other Ambulatory Visit: Payer: Self-pay

## 2021-06-07 ENCOUNTER — Other Ambulatory Visit (INDEPENDENT_AMBULATORY_CARE_PROVIDER_SITE_OTHER): Payer: Medicare Other

## 2021-06-07 ENCOUNTER — Ambulatory Visit: Payer: Medicare Other | Admitting: Obstetrics & Gynecology

## 2021-06-07 VITALS — BP 108/74

## 2021-06-07 DIAGNOSIS — R1032 Left lower quadrant pain: Secondary | ICD-10-CM

## 2021-06-07 DIAGNOSIS — R32 Unspecified urinary incontinence: Secondary | ICD-10-CM | POA: Diagnosis not present

## 2021-06-07 DIAGNOSIS — N951 Menopausal and female climacteric states: Secondary | ICD-10-CM

## 2021-06-07 DIAGNOSIS — N926 Irregular menstruation, unspecified: Secondary | ICD-10-CM

## 2021-06-07 MED ORDER — NORETHINDRONE 0.35 MG PO TABS: 1.0000 | ORAL_TABLET | Freq: Every day | ORAL | 4 refills | Status: DC

## 2021-06-07 NOTE — Progress Notes (Signed)
    Cristina Soto November 25, 1970 [id number]        50 y.o.  G1P0010 Single  RP: LLQ pain for Pelvic US  HPI: LLQ pain improved lately.  Oligomenorrhea with menses every 3 months.  Emotional lability.   OB History  Gravida Para Term Preterm AB Living  1       1 0  SAB IAB Ectopic Multiple Live Births               # Outcome Date GA Lbr Len/2nd Weight Sex Delivery Anes PTL Lv  1 AB             Past medical history,surgical history, problem list, medications, allergies, family history and social history were all reviewed and documented in the EPIC chart.   Directed ROS with pertinent positives and negatives documented in the history of present illness/assessment and plan.  Exam:  Vitals:   06/07/21 0836  BP: 108/74   General appearance:  Normal  Pelvic US today: T/V images.  Retroverted uterus normal in size and shape with no myometrial mass.  The uterus is measured at 6.53 x 4.7 x 3.19 cm.  Thin and symmetrical endometrial lining with no mass or thickening seen.  The endometrial thickness is measured at 2.1 mm.  Both ovaries are normal in size with 2 left ovarian cysts which are thick walled, avascular and echo-free.  One cyst is measured at 2.5 cm and the other 1 at 1.2 cm.  No adnexal mass otherwise.  Mild clear free fluid in the left adnexa.  Urine analysis negative   Assessment/Plan:  50 y.o. G1P0010   1. Left lower quadrant abdominal pain Left lower quadrant pain probably associated with rupture of a functional cyst.  Pelvic ultrasound findings thoroughly reviewed with patient and patient reassured.  Small amount of free fluid in the left adnexa with 2 functional cyst remaining on the left ovary.  2. Perimenopause Menopause with oligomenorrhea.  Decision to start on the progestin pill to control the cycle.  No contraindication.  Usage reviewed and prescription sent to pharmacy.  3. Urinary incontinence, unspecified type Urine analysis negative, patient reassured. -  Urinalysis,Complete w/RFL Culture  Other orders - SOOLANTRA 1 % CREA; Apply topically daily. (Patient not taking: Reported on 06/07/2021) - norethindrone (MICRONOR) 0.35 MG tablet; Take 1 tablet (0.35 mg total) by mouth daily.   Genia Del MD, 9:24 AM 06/07/2021

## 2021-06-09 LAB — URINALYSIS, COMPLETE W/RFL CULTURE
Bilirubin Urine: NEGATIVE
Glucose, UA: NEGATIVE
Hyaline Cast: NONE SEEN /LPF
Ketones, ur: NEGATIVE
Leukocyte Esterase: NEGATIVE
Nitrites, Initial: NEGATIVE
Protein, ur: NEGATIVE
Specific Gravity, Urine: 1.02 (ref 1.001–1.035)
pH: 6.5 (ref 5.0–8.0)

## 2021-06-09 LAB — URINE CULTURE
MICRO NUMBER:: 12174505
Result:: NO GROWTH
SPECIMEN QUALITY:: ADEQUATE

## 2021-06-09 LAB — CULTURE INDICATED

## 2021-06-11 ENCOUNTER — Encounter: Payer: Self-pay | Admitting: Obstetrics & Gynecology

## 2021-06-20 ENCOUNTER — Other Ambulatory Visit: Payer: Self-pay | Admitting: Nurse Practitioner

## 2021-06-20 DIAGNOSIS — E559 Vitamin D deficiency, unspecified: Secondary | ICD-10-CM

## 2021-06-21 ENCOUNTER — Encounter: Payer: Self-pay | Admitting: Psychology

## 2021-06-21 ENCOUNTER — Encounter: Payer: Medicare Other | Attending: Psychology | Admitting: Psychology

## 2021-06-21 ENCOUNTER — Other Ambulatory Visit: Payer: Self-pay

## 2021-06-21 DIAGNOSIS — F0630 Mood disorder due to known physiological condition, unspecified: Secondary | ICD-10-CM | POA: Diagnosis present

## 2021-06-21 DIAGNOSIS — G43001 Migraine without aura, not intractable, with status migrainosus: Secondary | ICD-10-CM | POA: Insufficient documentation

## 2021-06-21 DIAGNOSIS — F064 Anxiety disorder due to known physiological condition: Secondary | ICD-10-CM | POA: Insufficient documentation
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
## 2021-07-04 ENCOUNTER — Telehealth: Payer: Self-pay

## 2021-07-04 NOTE — Telephone Encounter (Signed)
Cristina Soto called to request Dr.Rodenbough to call her back she didn't leave detail message she just said for Dr.Rodenbough to call her back as soon as possible .

## 2021-07-11 ENCOUNTER — Telehealth: Payer: Self-pay | Admitting: *Deleted

## 2021-07-11 NOTE — Telephone Encounter (Signed)
Patient took 2 days of Micronor 0.35 mg tablet and reports she is going to stop the pill due to symptoms. Patient reports nausea, fatigue, slight headache, patient states pill was prescribed to help with perimenopausal symptoms. I told her best to give the pills at least 3 packs, however patient said symptoms are too severe to try for 3 months.  Just FYI

## 2021-07-24 ENCOUNTER — Telehealth: Payer: Self-pay | Admitting: Family Medicine

## 2021-07-24 NOTE — Telephone Encounter (Signed)
  Left message for patient to call back and schedule Medicare Annual Wellness Visit (AWV) by video or phone.  No hx of AWV eligible for AWVI as of 03/17/2020  Please schedule at any time with [facility] Advisor.      45-minute appointment   Any questions, please call me at [phone]

## 2021-08-27 ENCOUNTER — Other Ambulatory Visit: Payer: Self-pay | Admitting: Nurse Practitioner

## 2021-08-27 DIAGNOSIS — Z1231 Encounter for screening mammogram for malignant neoplasm of breast: Secondary | ICD-10-CM

## 2021-08-28 ENCOUNTER — Ambulatory Visit: Payer: Medicare Other

## 2021-09-10 ENCOUNTER — Other Ambulatory Visit: Payer: Self-pay | Admitting: Nurse Practitioner

## 2021-09-10 ENCOUNTER — Encounter: Payer: Self-pay | Admitting: Nurse Practitioner

## 2021-09-10 ENCOUNTER — Other Ambulatory Visit: Payer: Self-pay

## 2021-09-10 ENCOUNTER — Ambulatory Visit (INDEPENDENT_AMBULATORY_CARE_PROVIDER_SITE_OTHER): Payer: Medicare Other | Admitting: Nurse Practitioner

## 2021-09-10 VITALS — BP 116/76

## 2021-09-10 DIAGNOSIS — A630 Anogenital (venereal) warts: Secondary | ICD-10-CM | POA: Diagnosis not present

## 2021-09-10 DIAGNOSIS — R232 Flushing: Secondary | ICD-10-CM | POA: Diagnosis not present

## 2021-09-10 LAB — FOLLICLE STIMULATING HORMONE: FSH: 82.1 m[IU]/mL

## 2021-09-10 MED ORDER — IMIQUIMOD 3.75 % EX CREA: TOPICAL_CREAM | CUTANEOUS | 0 refills | Status: DC
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
## 2021-09-10 NOTE — Telephone Encounter (Signed)
[facility] placed a note on Rx stating ": Alternative Requested."

## 2021-09-19 ENCOUNTER — Ambulatory Visit (INDEPENDENT_AMBULATORY_CARE_PROVIDER_SITE_OTHER): Payer: Medicare Other | Admitting: Family Medicine

## 2021-09-19 ENCOUNTER — Encounter: Payer: Self-pay | Admitting: Family Medicine

## 2021-09-19 ENCOUNTER — Other Ambulatory Visit: Payer: Self-pay

## 2021-09-19 VITALS — BP 115/66 | HR 74 | Temp 97.9°F | Ht 66.0 in | Wt 135.6 lb

## 2021-09-19 DIAGNOSIS — F330 Major depressive disorder, recurrent, mild: Secondary | ICD-10-CM | POA: Diagnosis not present

## 2021-09-19 DIAGNOSIS — F411 Generalized anxiety disorder: Secondary | ICD-10-CM

## 2021-09-19 DIAGNOSIS — F410 Panic disorder [episodic paroxysmal anxiety] without agoraphobia: Secondary | ICD-10-CM

## 2021-09-19 DIAGNOSIS — L819 Disorder of pigmentation, unspecified: Secondary | ICD-10-CM

## 2021-09-19 NOTE — Progress Notes (Signed)
Assessment & Plan:  1-3. Panic attacks/Generalized anxiety disorder/Mild episode of recurrent major depressive disorder (HCC) - genesight testing - continue coping mechanisms that work well for anxiety reduction - will consider adding medication to treat pending genesight results  4. Pigmented skin lesion of suspected malignant nature - she will schedule an appointment with Dr. Terri Piedra with dermatology, she is established there   Follow up plan: Return in about 2 months (around 11/19/2021) for anxiety.  Floy Sabina, NP Student  I personally was present during the history, physical exam, and medical decision-making activities of this service and have verified that the service and findings are accurately documented in the nurse practitioner student's note.  Deliah Boston, MSN, APRN, FNP-C [facility]   Subjective:   Patient ID: Cristina Soto, female    DOB: [birth year], 50 y.o.   MRN: [id number]  HPI: Cristina Soto is a 50 y.o. female presenting on 09/19/2021 for Chest Pain (Chest tightness x 8 months on and off. Patient states when she has the chest tightness for over an hour and does not stop until she comes in contact with another person. )   She states she has had intermittent chest tightness that began during COVID and was related to limitations of activity and lack of social interaction. She states the events come on more when she is by herself and are relieved when she makes eye contact with other people or does activities such as sitting in the sun or going to a baseball game. She does not want to take an SSRI due to concerns about friends she has that became suicidal when stopping them. She states she copes well with taking an Advil and changing her environment when these events happen.   She is very concerned with the physical aspects of her anxiety which include freezing, chest tightness, and some difficulty sleeping. She is seeking reassurance  there there is nothing wrong with her heart and lungs. She is requesting a CXR to check for fluid on her lungs, although she does not experience a true respiratory distress with prolonged shortness of breath. She is tearful during this encounter and states she is very sensitive to medicines and is concerned about the risk of suicidal ideations while trying new medicines. She is agreeable to genesight testing as she wants to ensure she will tolerate a medicine well before taking one.   She also has a spot on her leg she is concerned about and would like a referral to a dermatologist.   Depression screen [facility] 2/9 09/19/2021 03/17/2020 03/17/2019  Decreased Interest 0 0 0  Down, Depressed, Hopeless 0 0 0  PHQ - 2 Score 0 0 0  Altered sleeping 2 - -  Tired, decreased energy 3 - -  Change in appetite 1 - -  Feeling bad or failure about yourself  1 - -  Trouble concentrating 2 - -  Moving slowly or fidgety/restless 0 - -  Suicidal thoughts 0 - -  PHQ-9 Score 9 - -  Difficult doing work/chores Somewhat difficult - -   GAD 7 : Generalized Anxiety Score 09/19/2021  Nervous, Anxious, on Edge 1  Control/stop worrying 0  Worry too much - different things 1  Trouble relaxing 1  Restless 0  Easily annoyed or irritable 0  Afraid - awful might happen 0  Total GAD 7 Score 3  Anxiety Difficulty Not difficult at all     ROS: Negative unless specifically indicated above in HPI.  Relevant past medical history reviewed and updated as indicated.   Allergies and medications reviewed and updated.   Current Outpatient Medications:    aspirin-acetaminophen-caffeine (EXCEDRIN MIGRAINE) 250-250-65 MG tablet, Take 2 tablets by mouth every 6 (six) hours as needed for headache., Disp: , Rfl:    Ibuprofen (ADVIL PO), Take by mouth., Disp: , Rfl:    Imiquimod 3.75 % CREA, Apply thin layer to area nighty until resolved. Max use of 8 weeks, Disp: 28 g, Rfl: 0   mupirocin ointment (BACTROBAN) 2 %, Apply 1 application  topically 2 (two) times daily., Disp: 30 g, Rfl: 0   SOOLANTRA 1 % CREA, Apply topically daily., Disp: , Rfl:   Allergies  Allergen Reactions   Corticosteroids Nausea And Vomiting   Erythromycin Nausea And Vomiting   Promethazine Hcl Nausea And Vomiting    Phenergan    Objective:   BP 115/66   Pulse 74   Temp 97.9 F (36.6 C) (Temporal)   Ht 5\' 6"  (1.676 m)   Wt 61.5 kg   SpO2 98%   BMI 21.89 kg/m    Physical Exam Vitals reviewed.  Constitutional:      General: She is not in acute distress.    Appearance: Normal appearance. She is not ill-appearing, toxic-appearing or diaphoretic.  HENT:     Head: Normocephalic and atraumatic.     Nose: Nose normal.     Mouth/Throat:     Mouth: Mucous membranes are moist.     Pharynx: Oropharynx is clear.  Eyes:     Extraocular Movements: Extraocular movements intact.     Conjunctiva/sclera: Conjunctivae normal.     Pupils: Pupils are equal, round, and reactive to light.  Cardiovascular:     Rate and Rhythm: Normal rate and regular rhythm.     Pulses: Normal pulses.     Heart sounds: Normal heart sounds.  Pulmonary:     Effort: Pulmonary effort is normal. No respiratory distress.     Breath sounds: Normal breath sounds. No wheezing or rhonchi.  Chest:     Chest wall: No tenderness.  Abdominal:     General: There is no distension.     Palpations: Abdomen is soft. There is no mass.  Musculoskeletal:        General: Normal range of motion.     Cervical back: Normal range of motion.  Skin:    General: Skin is warm and dry.     Capillary Refill: Capillary refill takes less than 2 seconds.     Findings: Lesion (right lateral thigh, dark brown, irregular, circular) present.  Neurological:     General: No focal deficit present.     Mental Status: She is alert and oriented to person, place, and time.     Motor: No weakness.     Gait: Gait normal.  Psychiatric:        Attention and Perception: Attention and perception normal.         Mood and Affect: Mood normal. Affect is tearful.        Speech: Speech normal.        Behavior: Behavior normal.        Thought Content: Thought content normal.        Judgment: Judgment normal.

## 2021-09-25 ENCOUNTER — Other Ambulatory Visit: Payer: Self-pay

## 2021-09-25 ENCOUNTER — Ambulatory Visit
Admission: RE | Admit: 2021-09-25 | Discharge: 2021-09-25 | Disposition: A | Discharge status: Home / Self Care | Payer: Medicare Other | Source: Ambulatory Visit | Attending: Nurse Practitioner | Admitting: Nurse Practitioner

## 2021-09-25 ENCOUNTER — Ambulatory Visit: Payer: Medicare Other

## 2021-09-25 DIAGNOSIS — Z1231 Encounter for screening mammogram for malignant neoplasm of breast: Secondary | ICD-10-CM

## 2021-09-27 ENCOUNTER — Telehealth: Payer: Self-pay | Admitting: Family Medicine

## 2021-09-27 ENCOUNTER — Other Ambulatory Visit: Payer: Self-pay | Admitting: Nurse Practitioner

## 2021-09-27 ENCOUNTER — Telehealth: Payer: Self-pay | Admitting: Psychology

## 2021-09-27 ENCOUNTER — Encounter: Payer: Self-pay | Admitting: Family Medicine

## 2021-09-27 DIAGNOSIS — F330 Major depressive disorder, recurrent, mild: Secondary | ICD-10-CM

## 2021-09-27 DIAGNOSIS — R928 Other abnormal and inconclusive findings on diagnostic imaging of breast: Secondary | ICD-10-CM

## 2021-09-27 DIAGNOSIS — F411 Generalized anxiety disorder: Secondary | ICD-10-CM

## 2021-09-27 NOTE — Telephone Encounter (Signed)
The patient called and ask to  receive phone call from Dr. Kieth Brightly today .

## 2021-09-27 NOTE — Telephone Encounter (Signed)
I just received her Gene Sight test results. She would do well with Prozac. Is she agreeable to starting?

## 2021-09-27 NOTE — Telephone Encounter (Signed)
Dr. Kieth Brightly called patient today .

## 2021-09-28 MED ORDER — FLUOXETINE HCL 20 MG PO CAPS
20.0000 mg | ORAL_CAPSULE | Freq: Every day | ORAL | 2 refills | Status: DC
Start: 2021-09-28 — End: 2021-11-27

## 2021-09-28 NOTE — Telephone Encounter (Signed)
Prozac sent to [facility].

## 2021-09-28 NOTE — Telephone Encounter (Signed)
Pt is ok to start on a low dose of Prozac. Please send to [facility].

## 2021-09-29 ENCOUNTER — Encounter: Payer: Self-pay | Admitting: Family Medicine

## 2021-10-09 ENCOUNTER — Telehealth: Payer: Self-pay | Admitting: Family Medicine

## 2021-10-09 NOTE — Telephone Encounter (Signed)
Called patient to schedule AWV and she asked to have someone contact her in reference to getting her test results.

## 2021-10-09 NOTE — Telephone Encounter (Signed)
Patient is requesting genesite testing

## 2021-10-09 NOTE — Telephone Encounter (Signed)
MyChart message sent to patient.

## 2021-10-15 ENCOUNTER — Ambulatory Visit (INDEPENDENT_AMBULATORY_CARE_PROVIDER_SITE_OTHER): Payer: Medicare Other

## 2021-10-15 ENCOUNTER — Other Ambulatory Visit: Payer: Self-pay | Admitting: Nurse Practitioner

## 2021-10-15 ENCOUNTER — Ambulatory Visit
Admission: RE | Admit: 2021-10-15 | Discharge: 2021-10-15 | Disposition: A | Discharge status: Home / Self Care | Payer: Medicare Other | Source: Ambulatory Visit | Attending: Nurse Practitioner | Admitting: Nurse Practitioner

## 2021-10-15 VITALS — Ht 66.0 in | Wt 135.0 lb

## 2021-10-15 DIAGNOSIS — R921 Mammographic calcification found on diagnostic imaging of breast: Secondary | ICD-10-CM

## 2021-10-15 DIAGNOSIS — H539 Unspecified visual disturbance: Secondary | ICD-10-CM | POA: Diagnosis not present

## 2021-10-15 DIAGNOSIS — Z0001 Encounter for general adult medical examination with abnormal findings: Secondary | ICD-10-CM | POA: Diagnosis not present

## 2021-10-15 DIAGNOSIS — G43909 Migraine, unspecified, not intractable, without status migrainosus: Secondary | ICD-10-CM

## 2021-10-15 DIAGNOSIS — Z1211 Encounter for screening for malignant neoplasm of colon: Secondary | ICD-10-CM

## 2021-10-15 DIAGNOSIS — Z1212 Encounter for screening for malignant neoplasm of rectum: Secondary | ICD-10-CM | POA: Diagnosis not present

## 2021-10-15 DIAGNOSIS — R928 Other abnormal and inconclusive findings on diagnostic imaging of breast: Secondary | ICD-10-CM

## 2021-10-15 DIAGNOSIS — Z0000 Encounter for general adult medical examination without abnormal findings: Secondary | ICD-10-CM

## 2021-10-15 NOTE — Progress Notes (Signed)
Subjective:   Cristina Soto is a 50 y.o. female who presents for Medicare Annual (Subsequent) preventive examination. Virtual Visit via Telephone Note  I connected with  [name] on 10/15/21 at  2:45 PM EST by telephone and verified that I am speaking with the correct person using two identifiers.  Location: Patient: Home Provider: [facility] Persons participating in the virtual visit: patient/Nurse Health Advisor   I discussed the limitations, risks, security and privacy concerns of performing an evaluation and management service by telephone and the availability of in person appointments. The patient expressed understanding and agreed to proceed.  Interactive audio and video telecommunications were attempted between this nurse and patient, however failed, due to patient having technical difficulties OR patient did not have access to video capability.  We continued and completed visit with audio only.  Some vital signs may be absent or patient reported.   Darral Dash, LPN  Review of Systems      Cardiac Risk Factors include: sedentary lifestyle;Other (see comment), Risk factor comments: Fibromyalgia, Migraines, Anxiety     Objective:    Today's Vitals   10/15/21 1443  Weight: 135 lb (61.2 kg)  Height:  (1.676 m)   Body mass index is 21.79 kg/m.  Advanced Directives 10/15/2021 03/17/2020 03/17/2019 06/18/2017  Does Patient Have a Medical Advance Directive? No No No No  Would patient like information on creating a medical advance directive? No - Patient declined No - Patient declined Yes (MAU/Ambulatory/Procedural Areas - Information given) Yes (ED - Information included in AVS)    Current Medications (verified) Outpatient Encounter Medications as of 10/15/2021  Medication Sig   aspirin-acetaminophen-caffeine (EXCEDRIN MIGRAINE) 250-250-65 MG tablet Take 2 tablets by mouth every 6 (six) hours as needed for headache.   Ibuprofen (ADVIL PO) Take by mouth.    Imiquimod 3.75 % CREA Apply thin layer to area nighty until resolved. Max use of 8 weeks   FLUoxetine (PROZAC) 20 MG capsule Take 1 capsule (20 mg total) by mouth daily. (Patient not taking: Reported on 10/15/2021)   mupirocin ointment (BACTROBAN) 2 % Apply 1 application topically 2 (two) times daily. (Patient not taking: Reported on 10/15/2021)   SOOLANTRA 1 % CREA Apply topically daily. (Patient not taking: Reported on 10/15/2021)   No facility-administered encounter medications on file as of 10/15/2021.    Allergies (verified) Corticosteroids, Erythromycin, and Promethazine hcl   History: Past Medical History:  Diagnosis Date   Anxiety    Asthma    Chronic fatigue fibromyalgia syndrome    CIN I (cervical intraepithelial neoplasia I) 2002   Depression    Fibromyalgia    Hemorrhoids    Migraine    Vulvar pruritus    Past Surgical History:  Procedure Laterality Date   COLPOSCOPY  2002   DILATION AND CURETTAGE OF UTERUS  1997   TH.AB   Family History  Problem Relation Age of Onset   Cancer Paternal Grandmother        Lung   Cancer Paternal Grandfather        Liver   Diverticulitis Father    Diverticulitis Sister    Alcohol abuse Sister    Asthma Mother    Colon cancer Neg Hx    Breast cancer Neg Hx    Social History   Socioeconomic History   Marital status: Single    Spouse name: Not on file   Number of children: 0   Years of education: Not on file  Highest education level: Bachelor's degree (e.g., BA, AB, BS)  Occupational History   Not on file  Tobacco Use   Smoking status: Never   Smokeless tobacco: Never  Vaping Use   Vaping Use: Never used  Substance and Sexual Activity   Alcohol use: Not Currently    Alcohol/week: 0.0 standard drinks   Drug use: No   Sexual activity: Not Currently    Birth control/protection: Abstinence    Comment: 1st intercourse 50 yo-More than 5 partners  Other Topics Concern   Not on file  Social History Narrative   Works part  time as a Clinical research associate.    Social Determinants of Health   Financial Resource Strain: Low Risk    Difficulty of Paying Living Expenses: Not very hard  Food Insecurity: Food Insecurity Present   Worried About Running Out of Food in the Last Year: Sometimes true   Ran Out of Food in the Last Year: Sometimes true  Transportation Needs: No Transportation Needs   Lack of Transportation (Medical): No   Lack of Transportation (Non-Medical): No  Physical Activity: Insufficiently Active   Days of Exercise per Week: 2 days   Minutes of Exercise per Session: 20 min  Stress: No Stress Concern Present   Feeling of Stress : Only a little  Social Connections: Moderately Integrated   Frequency of Communication with Friends and Family: More than three times a week   Frequency of Social Gatherings with Friends and Family: More than three times a week   Attends Religious Services: More than 4 times per year   Active Member of [employer] or Organizations: Yes   Attends Engineer, structural: More than 4 times per year   Marital Status: Never married    Tobacco Counseling Counseling given: Not Answered   Clinical Intake:  Pre-visit preparation completed: Yes  Pain : No/denies pain     BMI - recorded: 21.79 Nutritional Status: BMI of 19-24  Normal Nutritional Risks: None Diabetes: No  How often do you need to have someone help you when you read instructions, pamphlets, or other written materials from your doctor or pharmacy?: 1 - Never  Diabetic?no  Interpreter Needed?: No  Information entered by :: MJ Tasha Diaz, LPN   Activities of Daily Living In your present state of health, do you have any difficulty performing the following activities: 10/15/2021  Hearing? N  Vision? N  Difficulty concentrating or making decisions? N  Walking or climbing stairs? N  Dressing or bathing? N  Doing errands, shopping? N  Preparing Food and eating ? N  Using the Toilet? N  In the past six months, have  you accidently leaked urine? N  Do you have problems with loss of bowel control? N  Managing your Medications? N  Managing your Finances? N  Housekeeping or managing your Housekeeping? N  Some recent data might be hidden    Patient Care Team: Dettinger, Elige Radon, MD as PCP - General (Family Medicine)  Indicate any recent Medical Services you may have received from other than [facility] providers in the past year (date may be approximate).     Assessment:   This is a routine wellness examination for [name].  Hearing/Vision screen Hearing Screening - Comments:: No hearing issues.  Vision Screening - Comments:: Glasses. Dr. Lucretia Roers, 3 years. Would like referral.  Dietary issues and exercise activities discussed: Current Exercise Habits: Home exercise routine, Type of exercise: walking, Time (Minutes): 15, Frequency (Times/Week): 2, Weekly Exercise (Minutes/Week): 30, Intensity: Mild, Exercise  limited by: neurologic condition(s)   Goals Addressed             This Visit's Progress    Exercise 3x per week (30 min per time)   Not on track    Increase cardiovascular exercise such as walking or stationary bike to 3-4 times per week for 30 minutes each session.       Have 3 meals a day       Eat healthier and improve health. Would like to return back to school.        Depression Screen PHQ 2/9 Scores 10/15/2021 09/19/2021 03/17/2020 03/17/2019 03/17/2019 10/09/2018 09/28/2018  PHQ - 2 Score 0 0 0 0 0 0 0  PHQ- 9 Score 0 9 - - - - -    Fall Risk Fall Risk  10/15/2021 03/17/2020 03/17/2019 10/09/2018 05/07/2018  Falls in the past year? 0 0 0 0 No  Number falls in past yr: 0 - - - -  Injury with Fall? 0 - - - -  Risk for fall due to : Impaired balance/gait - - - -  Follow up Falls prevention discussed - Education provided;Falls prevention discussed - -    FALL RISK PREVENTION PERTAINING TO THE HOME:  Any stairs in or around the home? Yes  If so, are there any without handrails? No  Home  free of loose throw rugs in walkways, pet beds, electrical cords, etc? Yes  Adequate lighting in your home to reduce risk of falls? Yes   ASSISTIVE DEVICES UTILIZED TO PREVENT FALLS:  Life alert? Yes  Use of a cane, walker or w/c? Yes  Grab bars in the bathroom? No  Shower chair or bench in shower? No  Elevated toilet seat or a handicapped toilet? No   TIMED UP AND GO:  Was the test performed? No .  Phone visit  Cognitive Function: MMSE - Mini Mental State Exam 06/18/2017  Orientation to time 5  Orientation to Place 5  Registration 3  Attention/ Calculation 5  Recall 3  Language- name 2 objects 2  Language- repeat 1  Language- follow 3 step command 3  Language- read & follow direction 1  Write a sentence 1  Copy design 1  Total score 30     6CIT Screen 03/17/2020 03/17/2019  What Year? 0 points 0 points  What month? 0 points 0 points  What time? 0 points 0 points  Count back from 20 0 points 0 points  Months in reverse 0 points 0 points  Repeat phrase 0 points 0 points  Total Score 0 0    Immunizations Immunization History  Administered Date(s) Administered   Influenza, Quadrivalent, Recombinant, Inj, Pf 12/17/2016, 09/25/2017, 12/14/2018   PFIZER(Purple Top)SARS-COV-2 Vaccination 11/09/2020    TDAP status: Up to date  Flu Vaccine status: Due, Education has been provided regarding the importance of this vaccine. Advised may receive this vaccine at local pharmacy or Health Dept. Aware to provide a copy of the vaccination record if obtained from local pharmacy or Health Dept. Verbalized acceptance and understanding.  Pneumococcal vaccine status: Declined,  Education has been provided regarding the importance of this vaccine but patient still declined. Advised may receive this vaccine at local pharmacy or Health Dept. Aware to provide a copy of the vaccination record if obtained from local pharmacy or Health Dept. Verbalized acceptance and understanding.   Covid-19  vaccine status: Information provided on how to obtain vaccines.   Qualifies for Shingles Vaccine? Yes   Zostavax  completed No   Shingrix Completed?: No.    Education has been provided regarding the importance of this vaccine. Patient has been advised to call insurance company to determine out of pocket expense if they have not yet received this vaccine. Advised may also receive vaccine at local pharmacy or Health Dept. Verbalized acceptance and understanding.  Screening Tests Health Maintenance  Topic Date Due   HIV Screening  Never done   Hepatitis C Screening  Never done   TETANUS/TDAP  Never done   COLONOSCOPY (Pts 45-56yrs Insurance coverage will need to be confirmed)  Never done   Zoster Vaccines- Shingrix (1 of 2) Never done   COVID-19 Vaccine (2 - Pfizer series) 11/30/2020   INFLUENZA VACCINE  06/11/2021   PAP SMEAR-Modifier  01/14/2023   MAMMOGRAM  10/16/2023   Pneumococcal Vaccine 33-84 Years old  Aged Out   HPV VACCINES  Aged Out    Health Maintenance  Health Maintenance Due  Topic Date Due   HIV Screening  Never done   Hepatitis C Screening  Never done   TETANUS/TDAP  Never done   COLONOSCOPY (Pts 45-38yrs Insurance coverage will need to be confirmed)  Never done   Zoster Vaccines- Shingrix (1 of 2) Never done   COVID-19 Vaccine (2 - Pfizer series) 11/30/2020   INFLUENZA VACCINE  06/11/2021    Colorectal cancer screening: Referral to GI placed 10/15/2021. Pt aware the office will call re: appt. Cologuard  Mammogram status: Completed 09/25/2021. Repeat every year  Bone Density status: Ordered NOT DUE UNTIL AGE 25. Pt provided with contact info and advised to call to schedule appt.  Lung Cancer Screening: (Low Dose CT Chest recommended if Age 59-80 years, 30 pack-year currently smoking OR have quit w/in 15years.) does not qualify.  NON SMOKER  Additional Screening:  Hepatitis C Screening: does not qualify;  Vision Screening: Recommended annual ophthalmology  exams for early detection of glaucoma and other disorders of the eye. Is the patient up to date with their annual eye exam?  No  Who is the provider or what is the name of the office in which the patient attends annual eye exams? Dr. Lucretia Roers If pt is not established with a provider, would they like to be referred to a provider to establish care? Yes .   Dental Screening: Recommended annual dental exams for proper oral hygiene  Community Resource Referral / Chronic Care Management: CRR required this visit?  No   CCM required this visit?  No      Plan:     I have personally reviewed and noted the following in the patient's chart:   Medical and social history Use of alcohol, tobacco or illicit drugs  Current medications and supplements including opioid prescriptions.  Functional ability and status Nutritional status Physical activity Advanced directives List of other physicians Hospitalizations, surgeries, and ER visits in previous 12 months Vitals Screenings to include cognitive, depression, and falls Referrals and appointments  In addition, I have reviewed and discussed with patient certain preventive protocols, quality metrics, and best practice recommendations. A written personalized care plan for preventive services as well as general preventive health recommendations were provided to patient.     Darral Dash, LPN   16/11/958   Nurse Notes: Phone visit. Pt at home. Nurse at [facility]. Discussed ate appropriate vaccines and how to obtain. Pt would like opthalmology referral. Order placed. Pt would like to schedule Cologuard. Order placed. Pt states she had breast u/s today and is  scheduled for breast bx next week.

## 2021-10-15 NOTE — Patient Instructions (Signed)
Cristina Soto , Thank you for taking time to come for your Medicare Wellness Visit. I appreciate your ongoing commitment to your health goals. Please review the following plan we discussed and let me know if I can assist you in the future.   Screening recommendations/referrals: Colonoscopy:Cologuard order placed today. Mammogram: Done 1115/2022 Repeat annually  Bone Density: Not due until age 51  Recommended yearly ophthalmology/optometry visit for glaucoma screening and checkup Recommended yearly dental visit for hygiene and checkup  Vaccinations: Influenza vaccine: Due Repeat annually  Pneumococcal vaccine: Not due until age 73. Tdap vaccine: Done. Repeat in 10 years  Shingles vaccine: Shingrix discussed. Please contact your pharmacy for coverage information.    Covid-19: Done 11/09/2020  Advanced directives: Advance directive discussed with you today. I have provided a copy for you to complete at home and have notarized. Once this is complete please bring a copy in to our office so we can scan it into your chart. Mailed out today.  Conditions/risks identified: Aim for 30 minutes of exercise or brisk walking each day, drink 6-8 glasses of water and eat lots of fruits and vegetables.   Next appointment: Follow up in one year for your annual wellness visit. 2023.  Preventive Care 40-64 Years, Female Preventive care refers to lifestyle choices and visits with your health care provider that can promote health and wellness. What does preventive care include? A yearly physical exam. This is also called an annual well check. Dental exams once or twice a year. Routine eye exams. Ask your health care provider how often you should have your eyes checked. Personal lifestyle choices, including: Daily care of your teeth and gums. Regular physical activity. Eating a healthy diet. Avoiding tobacco and drug use. Limiting alcohol use. Practicing safe sex. Taking low-dose aspirin daily starting  at age 37. Taking vitamin and mineral supplements as recommended by your health care provider. What happens during an annual well check? The services and screenings done by your health care provider during your annual well check will depend on your age, overall health, lifestyle risk factors, and family history of disease. Counseling  Your health care provider may ask you questions about your: Alcohol use. Tobacco use. Drug use. Emotional well-being. Home and relationship well-being. Sexual activity. Eating habits. Work and work Statistician. Method of birth control. Menstrual cycle. Pregnancy history. Screening  You may have the following tests or measurements: Height, weight, and BMI. Blood pressure. Lipid and cholesterol levels. These may be checked every 5 years, or more frequently if you are over 57 years old. Skin check. Lung cancer screening. You may have this screening every year starting at age 23 if you have a 30-pack-year history of smoking and currently smoke or have quit within the past 15 years. Fecal occult blood test (FOBT) of the stool. You may have this test every year starting at age 44. Flexible sigmoidoscopy or colonoscopy. You may have a sigmoidoscopy every 5 years or a colonoscopy every 10 years starting at age 38. Hepatitis C blood test. Hepatitis B blood test. Sexually transmitted disease (STD) testing. Diabetes screening. This is done by checking your blood sugar (glucose) after you have not eaten for a while (fasting). You may have this done every 1-3 years. Mammogram. This may be done every 1-2 years. Talk to your health care provider about when you should start having regular mammograms. This may depend on whether you have a family history of breast cancer. BRCA-related cancer screening. This may be done if you have  a family history of breast, ovarian, tubal, or peritoneal cancers. Pelvic exam and Pap test. This may be done every 3 years starting at age 25.  Starting at age 74, this may be done every 5 years if you have a Pap test in combination with an HPV test. Bone density scan. This is done to screen for osteoporosis. You may have this scan if you are at high risk for osteoporosis. Discuss your test results, treatment options, and if necessary, the need for more tests with your health care provider. Vaccines  Your health care provider may recommend certain vaccines, such as: Influenza vaccine. This is recommended every year. Tetanus, diphtheria, and acellular pertussis (Tdap, Td) vaccine. You may need a Td booster every 10 years. Zoster vaccine. You may need this after age 28. Pneumococcal 13-valent conjugate (PCV13) vaccine. You may need this if you have certain conditions and were not previously vaccinated. Pneumococcal polysaccharide (PPSV23) vaccine. You may need one or two doses if you smoke cigarettes or if you have certain conditions. Talk to your health care provider about which screenings and vaccines you need and how often you need them. This information is not intended to replace advice given to you by your health care provider. Make sure you discuss any questions you have with your health care provider. Document Released: 11/24/2015 Document Revised: 07/17/2016 Document Reviewed: 08/29/2015 Elsevier Interactive Patient Education  2017 [employer] Prevention in the Home Falls can cause injuries. They can happen to people of all ages. There are many things you can do to make your home safe and to help prevent falls. What can I do on the outside of my home? Regularly fix the edges of walkways and driveways and fix any cracks. Remove anything that might make you trip as you walk through a door, such as a raised step or threshold. Trim any bushes or trees on the path to your home. Use bright outdoor lighting. Clear any walking paths of anything that might make someone trip, such as rocks or tools. Regularly check to see if  handrails are loose or broken. Make sure that both sides of any steps have handrails. Any raised decks and porches should have guardrails on the edges. Have any leaves, snow, or ice cleared regularly. Use sand or salt on walking paths during winter. Clean up any spills in your garage right away. This includes oil or grease spills. What can I do in the bathroom? Use night lights. Install grab bars by the toilet and in the tub and shower. Do not use towel bars as grab bars. Use non-skid mats or decals in the tub or shower. If you need to sit down in the shower, use a plastic, non-slip stool. Keep the floor dry. Clean up any water that spills on the floor as soon as it happens. Remove soap buildup in the tub or shower regularly. Attach bath mats securely with double-sided non-slip rug tape. Do not have throw rugs and other things on the floor that can make you trip. What can I do in the bedroom? Use night lights. Make sure that you have a light by your bed that is easy to reach. Do not use any sheets or blankets that are too big for your bed. They should not hang down onto the floor. Have a firm chair that has side arms. You can use this for support while you get dressed. Do not have throw rugs and other things on the floor that can  make you trip. What can I do in the kitchen? Clean up any spills right away. Avoid walking on wet floors. Keep items that you use a lot in easy-to-reach places. If you need to reach something above you, use a strong step stool that has a grab bar. Keep electrical cords out of the way. Do not use floor polish or wax that makes floors slippery. If you must use wax, use non-skid floor wax. Do not have throw rugs and other things on the floor that can make you trip. What can I do with my stairs? Do not leave any items on the stairs. Make sure that there are handrails on both sides of the stairs and use them. Fix handrails that are broken or loose. Make sure that  handrails are as long as the stairways. Check any carpeting to make sure that it is firmly attached to the stairs. Fix any carpet that is loose or worn. Avoid having throw rugs at the top or bottom of the stairs. If you do have throw rugs, attach them to the floor with carpet tape. Make sure that you have a light switch at the top of the stairs and the bottom of the stairs. If you do not have them, ask someone to add them for you. What else can I do to help prevent falls? Wear shoes that: Do not have high heels. Have rubber bottoms. Are comfortable and fit you well. Are closed at the toe. Do not wear sandals. If you use a stepladder: Make sure that it is fully opened. Do not climb a closed stepladder. Make sure that both sides of the stepladder are locked into place. Ask someone to hold it for you, if possible. Clearly mark and make sure that you can see: Any grab bars or handrails. First and last steps. Where the edge of each step is. Use tools that help you move around (mobility aids) if they are needed. These include: Canes. Walkers. Scooters. Crutches. Turn on the lights when you go into a dark area. Replace any light bulbs as soon as they burn out. Set up your furniture so you have a clear path. Avoid moving your furniture around. If any of your floors are uneven, fix them. If there are any pets around you, be aware of where they are. Review your medicines with your doctor. Some medicines can make you feel dizzy. This can increase your chance of falling. Ask your doctor what other things that you can do to help prevent falls. This information is not intended to replace advice given to you by your health care provider. Make sure you discuss any questions you have with your health care provider. Document Released: 08/24/2009 Document Revised: 04/04/2016 Document Reviewed: 12/02/2014 Elsevier Interactive Patient Education  2017 [employer].

## 2021-10-22 ENCOUNTER — Encounter: Payer: Medicare Other | Admitting: Psychology

## 2021-10-22 ENCOUNTER — Other Ambulatory Visit: Payer: Self-pay

## 2021-10-22 ENCOUNTER — Encounter: Payer: Medicare Other | Attending: Psychology | Admitting: Psychology

## 2021-10-22 ENCOUNTER — Telehealth: Payer: Self-pay | Admitting: Family Medicine

## 2021-10-22 DIAGNOSIS — F0630 Mood disorder due to known physiological condition, unspecified: Secondary | ICD-10-CM | POA: Diagnosis not present

## 2021-10-22 DIAGNOSIS — F064 Anxiety disorder due to known physiological condition: Secondary | ICD-10-CM | POA: Diagnosis not present
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
## 2021-10-23 NOTE — Telephone Encounter (Signed)
Patient aware and verbalizes understanding. 

## 2021-10-23 NOTE — Telephone Encounter (Signed)
Her prescription should still be at the pharmacy. She just needs to ask them to get it ready for her.

## 2021-10-24 ENCOUNTER — Inpatient Hospital Stay: Admission: RE | Admit: 2021-10-24 | Payer: Medicare Other | Source: Ambulatory Visit

## 2021-10-28 ENCOUNTER — Encounter (HOSPITAL_BASED_OUTPATIENT_CLINIC_OR_DEPARTMENT_OTHER): Payer: Self-pay

## 2021-10-28 ENCOUNTER — Other Ambulatory Visit: Payer: Self-pay

## 2021-10-28 ENCOUNTER — Emergency Department (HOSPITAL_BASED_OUTPATIENT_CLINIC_OR_DEPARTMENT_OTHER)
Admission: EM | Admit: 2021-10-28 | Discharge: 2021-10-28 | Disposition: A | Discharge status: Home / Self Care | Payer: Medicare Other | Attending: Emergency Medicine | Admitting: Emergency Medicine

## 2021-10-28 DIAGNOSIS — M791 Myalgia, unspecified site: Secondary | ICD-10-CM | POA: Diagnosis not present

## 2021-10-28 DIAGNOSIS — J45909 Unspecified asthma, uncomplicated: Secondary | ICD-10-CM | POA: Insufficient documentation

## 2021-10-28 DIAGNOSIS — Z7982 Long term (current) use of aspirin: Secondary | ICD-10-CM | POA: Insufficient documentation

## 2021-10-28 DIAGNOSIS — Z8616 Personal history of COVID-19: Secondary | ICD-10-CM | POA: Insufficient documentation

## 2021-10-28 DIAGNOSIS — R051 Acute cough: Secondary | ICD-10-CM | POA: Diagnosis not present

## 2021-10-28 MED ORDER — BENZONATATE 100 MG PO CAPS: 100.0000 mg | ORAL_CAPSULE | Freq: Three times a day (TID) | ORAL | 0 refills | Status: DC

## 2021-10-28 NOTE — Discharge Instructions (Addendum)
I have sent the medication for cough to the pharmacy, you do not have to take this unless you need it.  You do not qualify for antiviral treatment however I do not believe you have needed at this time either.  You should be okay to staying in your concert.

## 2021-10-28 NOTE — ED Provider Notes (Signed)
[facility] EMERGENCY DEPT Provider Note   CSN: [id number] Arrival date & time: 10/28/21  1536     History Chief Complaint  Patient presents with   Covid Positive    Cristina Soto is a 50 y.o. female has medical history of fibromyalgia and anxiety presenting today with a complaint of COVID-19.  Patient reports that on Tuesday she began to have a migraine and that on Wednesday this migraine worsened and turned into body aches.  Over the last few days her body aches have worsened.  Had a 100.8 fever a few days ago however no more fevers.  Denies chest pain or shortness of breath.  Has a cough that is nonproductive.  She has a concert where she has to sing this upcoming weekend and is hoping for relief.   Past Medical History:  Diagnosis Date   Anxiety    Asthma    Chronic fatigue fibromyalgia syndrome    CIN I (cervical intraepithelial neoplasia I) 2002   Depression    Fibromyalgia    Hemorrhoids    Migraine    Vulvar pruritus     Patient Active Problem List   Diagnosis Date Noted   Chronic sinusitis 11/13/2018   Hoarseness of voice 11/13/2018   Migraine 04/30/2017   Anxiety 04/30/2017   Constipation 04/30/2017   Excessive cerumen in both ear canals 05/02/2016   Referred otalgia of both ears 05/02/2016   Hemorrhoids 04/01/2012    Past Surgical History:  Procedure Laterality Date   COLPOSCOPY  2002   DILATION AND CURETTAGE OF UTERUS  1997   TH.AB     OB History     Gravida  1   Para      Term      Preterm      AB  1   Living  0      SAB      IAB      Ectopic      Multiple      Live Births              Family History  Problem Relation Age of Onset   Cancer Paternal Grandmother        Lung   Cancer Paternal Grandfather        Liver   Diverticulitis Father    Diverticulitis Sister    Alcohol abuse Sister    Asthma Mother    Colon cancer Neg Hx    Breast cancer Neg Hx     Social History   Tobacco Use    Smoking status: Never   Smokeless tobacco: Never  Vaping Use   Vaping Use: Never used  Substance Use Topics   Alcohol use: Not Currently    Alcohol/week: 0.0 standard drinks   Drug use: No    Home Medications Prior to Admission medications   Medication Sig Start Date End Date Taking? Authorizing Provider  benzonatate (TESSALON) 100 MG capsule Take 1 capsule (100 mg total) by mouth every 8 (eight) hours. 10/28/21  Yes Donnell Wion A, PA-C  aspirin-acetaminophen-caffeine (EXCEDRIN MIGRAINE) [phone] MG tablet Take 2 tablets by mouth every 6 (six) hours as needed for headache.    [provider]  FLUoxetine (PROZAC) 20 MG capsule Take 1 capsule (20 mg total) by mouth daily. Patient not taking: Reported on 10/15/2021 09/28/21   Gwenlyn Fudge, FNP  Ibuprofen (ADVIL PO) Take by mouth.    [provider]  Imiquimod 3.75 % CREA Apply thin layer to  area nighty until resolved. Max use of 8 weeks 09/10/21   Wyline Beady A, NP  mupirocin ointment (BACTROBAN) 2 % Apply 1 application topically 2 (two) times daily. Patient not taking: Reported on 10/15/2021 08/16/20   Gwenlyn Fudge, FNP  SOOLANTRA 1 % CREA Apply topically daily. Patient not taking: Reported on 10/15/2021 05/24/21   [provider]    Allergies    Corticosteroids, Erythromycin, and Promethazine hcl  Review of Systems   Review of Systems  Constitutional:  Positive for fever. Negative for chills.  Respiratory:  Negative for shortness of breath.   Cardiovascular:  Negative for chest pain.  Gastrointestinal:  Negative for diarrhea and vomiting.  Musculoskeletal:  Positive for myalgias.  Neurological:  Positive for headaches.   Physical Exam Updated Vital Signs BP 126/64 (BP Location: Right Arm)    Pulse 91    Temp 98.5 F (36.9 C) (Oral)    Resp 20    Ht 5\' 4"  (1.626 m)    Wt 60.8 kg    LMP 05/22/2021    SpO2 97%    BMI 23.00 kg/m   Physical Exam Vitals and nursing note reviewed.   Constitutional:      Appearance: Normal appearance.  HENT:     Head: Normocephalic and atraumatic.     Right Ear: Tympanic membrane normal.     Left Ear: Tympanic membrane normal.     Nose: Nose normal.     Mouth/Throat:     Mouth: Mucous membranes are moist.     Pharynx: Oropharynx is clear. No posterior oropharyngeal erythema.  Eyes:     General: No scleral icterus.    Conjunctiva/sclera: Conjunctivae normal.  Cardiovascular:     Rate and Rhythm: Normal rate and regular rhythm.  Pulmonary:     Effort: Pulmonary effort is normal. No respiratory distress.     Breath sounds: No wheezing or rales.  Skin:    General: Skin is warm and dry.     Findings: No rash.  Neurological:     Mental Status: She is alert.  Psychiatric:        Mood and Affect: Mood normal.    ED Results / Procedures / Treatments   Labs (all labs ordered are listed, but only abnormal results are displayed) Labs Reviewed - No data to display  EKG None  Radiology No results found.  Procedures Procedures   Medications Ordered in ED Medications - No data to display  ED Course  I have reviewed the triage vital signs and the nursing notes.  Pertinent labs & imaging results that were available during my care of the patient were reviewed by me and considered in my medical decision making (see chart for details).    MDM Rules/Calculators/A&P 50 year old female presenting with a complaint of COVID.  She would like to feel better by her concert next weekend.  Does not qualify for antiviral treatment.  Stable physical exam, afebrile and not tachycardic.  We will discharged with benzonatate and CDC recommendations. Final Clinical Impression(s) / ED Diagnoses Final diagnoses:  Acute cough    Rx / DC Orders ED Discharge Orders          Ordered    benzonatate (TESSALON) 100 MG capsule  Every 8 hours        10/28/21 1709          Results and diagnoses were explained to the patient. Return  precautions discussed in full. Patient had no additional questions and expressed complete understanding.  Woodroe Chen 10/28/21 1712    Pollyann Savoy, MD 10/28/21 [phone]

## 2021-10-28 NOTE — ED Triage Notes (Addendum)
Pt presents with COVID + since 12/14. Pt c/o body aches, "low grade" fever, "head cold." Denies ShOB. Pt states her goal is to be better by next Saturday.

## 2021-11-15 ENCOUNTER — Telehealth: Payer: Self-pay | Admitting: *Deleted

## 2021-11-15 NOTE — Telephone Encounter (Signed)
Patient called and left message c/o hot flashes, mood swings, and crying. I called patient and she reports hot flashes are bad and she cries a lot when not around people. She is scheduled for annual exam on 12/03/21. I explained Tiffany will need to see her, we can schedule quick visit to address hot flashes and emotions and still keep annual exam. Message sent to appointments to call and schedule.

## 2021-11-27 ENCOUNTER — Other Ambulatory Visit: Payer: Self-pay

## 2021-11-27 ENCOUNTER — Encounter: Payer: Self-pay | Admitting: Nurse Practitioner

## 2021-11-27 ENCOUNTER — Ambulatory Visit (INDEPENDENT_AMBULATORY_CARE_PROVIDER_SITE_OTHER): Payer: Medicare Other | Admitting: Nurse Practitioner

## 2021-11-27 VITALS — BP 116/74

## 2021-11-27 DIAGNOSIS — F411 Generalized anxiety disorder: Secondary | ICD-10-CM

## 2021-11-27 DIAGNOSIS — F410 Panic disorder [episodic paroxysmal anxiety] without agoraphobia: Secondary | ICD-10-CM | POA: Diagnosis not present

## 2021-11-27 DIAGNOSIS — R232 Flushing: Secondary | ICD-10-CM | POA: Diagnosis not present

## 2021-11-27 NOTE — Progress Notes (Signed)
° °  Acute Office Visit  Subjective:    Patient ID: Cristina Soto, female    DOB: Jul 12, 1971, 51 y.o.   MRN: [id number]   HPI 51 y.o. presents today to discuss hot flashes. She has actually had improvement in these the last 2 weeks since she has had some improvement in anxiety. She has been struggling with anxiety and panic attacks (up to 5 per day) that appear to be related to pandemic and restrictions associated.  Some days she cries most of the day. She is seeing therapist, is in happy relationship, and is now working face-to-face instead of remote due to worsening anxiety related to being home. She does best when she is socially interacting with others. She was prescribed Prozac by PCP but she did not start due to fear of side effects.   Review of Systems  Constitutional: Negative.   Psychiatric/Behavioral:  Positive for dysphoric mood. Negative for self-injury and suicidal ideas. The patient is nervous/anxious.       Objective:    Physical Exam Constitutional:      Appearance: Normal appearance.  Psychiatric:        Attention and Perception: Attention normal.        Mood and Affect: Mood normal. Mood is not anxious or depressed.        Speech: Speech normal.        Behavior: Behavior normal.    BP 116/74  Wt Readings from Last 3 Encounters:  10/28/21 134 lb (60.8 kg)  10/15/21 135 lb (61.2 kg)  09/19/21 135 lb 9.6 oz (61.5 kg)        Assessment & Plan:   Problem List Items Addressed This Visit   None Visit Diagnoses     Generalized anxiety disorder with panic attacks    -  Primary   Hot flashes          Plan: Discussed nature of hot flashes and how they appear to correspond with her anxiety and panic attacks. Recommend continuing therapy, working face-to-face, adding exercise to routine, and keeping busy. She wanted an opinion on Prozac, so we discussed how this can help with panic attacks, as well as vasomotor symptoms but must be taken consistently for best  results. She is worried about side effects so we discussed what she could expect and that these typically subside after a few weeks. She wants to see therapist next week and will let me know if she decides to start Prozac. She prefer lowest does of 10 mg.      Olivia Mackie DNP, 11:00 AM 11/27/2021

## 2021-12-03 ENCOUNTER — Ambulatory Visit: Payer: Medicare Other | Admitting: Nurse Practitioner

## 2021-12-19 ENCOUNTER — Telehealth: Payer: Self-pay | Admitting: *Deleted

## 2021-12-19 ENCOUNTER — Other Ambulatory Visit: Payer: Self-pay | Admitting: Nurse Practitioner

## 2021-12-19 DIAGNOSIS — F410 Panic disorder [episodic paroxysmal anxiety] without agoraphobia: Secondary | ICD-10-CM

## 2021-12-19 MED ORDER — FLUOXETINE HCL 10 MG PO CAPS: 10.0000 mg | ORAL_CAPSULE | Freq: Every day | ORAL | 2 refills | Status: DC

## 2021-12-19 NOTE — Telephone Encounter (Signed)
Patient informed below. Reports she not going to start medication now, she has a singing event this weekend and will start after this event.

## 2021-12-19 NOTE — Telephone Encounter (Signed)
We had a long discussion about this regarding importance of consistent use, initial side effects, time it takes to be fully effective. I will send this in for her.

## 2021-12-19 NOTE — Telephone Encounter (Signed)
I would like her schedule OV in 6 weeks to follow up on medication.

## 2021-12-19 NOTE — Telephone Encounter (Signed)
Patient called to update from OV on 11/27/21, reports she has been doing well exercising and life balance. However she would like to try the Prozac Rx, reports she still does have days of emotion anxiety. She said If you need to call her you can. Rx should be sent to [facility] on [address]. Please advise

## 2022-01-03 ENCOUNTER — Telehealth: Payer: Self-pay | Admitting: *Deleted

## 2022-01-03 NOTE — Telephone Encounter (Signed)
Patient called c/o splitting headache since Monday, also with spine and neck pain, and hot flashes all night. She has not started the Prozac 10 mg capsule yet,due to her menopausal symptoms.  Patient did mention having a stressful day this week, reports being sensitive to light and noises. She called asking to speak with you directly. I asked if she has PCP and patient replied "not really". She asked if migraine medication could be prescribed? Please advise

## 2022-01-03 NOTE — Telephone Encounter (Signed)
Left message for patient to call.

## 2022-01-03 NOTE — Telephone Encounter (Signed)
She has discussed migraines with PCP in the past, so it is recommended she see them for management. The Prozac can actually help with the vasomotor symptoms of menopause as well as with her anxiety. Not sure if the spine/neck pain is causing the headache or what the nature of this could be without seeing her. I recommend Ibuprofen 800 mg every 8 hours or Excedrin for headache (she has this listed as a current medication in her chart) and the Ibuprofen can also help with neck pain.

## 2022-01-05 ENCOUNTER — Emergency Department (HOSPITAL_BASED_OUTPATIENT_CLINIC_OR_DEPARTMENT_OTHER)
Admission: EM | Admit: 2022-01-05 | Discharge: 2022-01-06 | Disposition: A | Discharge status: Home / Self Care | Payer: Medicare Other | Attending: Emergency Medicine | Admitting: Emergency Medicine

## 2022-01-05 ENCOUNTER — Encounter (HOSPITAL_BASED_OUTPATIENT_CLINIC_OR_DEPARTMENT_OTHER): Payer: Self-pay

## 2022-01-05 ENCOUNTER — Other Ambulatory Visit: Payer: Self-pay

## 2022-01-05 DIAGNOSIS — R197 Diarrhea, unspecified: Secondary | ICD-10-CM | POA: Diagnosis present

## 2022-01-05 DIAGNOSIS — K529 Noninfective gastroenteritis and colitis, unspecified: Secondary | ICD-10-CM | POA: Insufficient documentation

## 2022-01-05 LAB — COMPREHENSIVE METABOLIC PANEL
ALT: 33 U/L (ref 0–44)
AST: 24 U/L (ref 15–41)
Albumin: 4 g/dL (ref 3.5–5.0)
Alkaline Phosphatase: 68 U/L (ref 38–126)
Anion gap: 8 (ref 5–15)
BUN: 13 mg/dL (ref 6–20)
CO2: 26 mmol/L (ref 22–32)
Calcium: 9.1 mg/dL (ref 8.9–10.3)
Chloride: 104 mmol/L (ref 98–111)
Creatinine, Ser: 0.68 mg/dL (ref 0.44–1.00)
GFR, Estimated: >60 mL/min (ref >60)
Glucose, Bld: 94 mg/dL (ref 70–99)
Potassium: 3.5 mmol/L (ref 3.5–5.1)
Sodium: 138 mmol/L (ref 135–145)
Total Bilirubin: 0.7 mg/dL (ref 0.3–1.2)
Total Protein: 6.3 g/dL — ABNORMAL LOW (ref 6.5–8.1)

## 2022-01-05 LAB — CBC
HCT: 40.8 % (ref 36.0–46.0)
Hemoglobin: 13.3 g/dL (ref 12.0–15.0)
MCH: 29.1 pg (ref 26.0–34.0)
MCHC: 32.6 g/dL (ref 30.0–36.0)
MCV: 89.3 fL (ref 80.0–100.0)
Platelets: 192 10*3/uL (ref 150–400)
RBC: 4.57 MIL/uL (ref 3.87–5.11)
RDW: 12.5 % (ref 11.5–15.5)
WBC: 8.6 10*3/uL (ref 4.0–10.5)
nRBC: 0 % (ref 0.0–0.2)

## 2022-01-05 LAB — LIPASE, BLOOD: Lipase: 28 U/L (ref 11–51)

## 2022-01-05 NOTE — ED Triage Notes (Signed)
Pt presents to the ED POV from home. States that she had several episodes of bloody stool today. Reports a burning pain primarily in her abdomen in the LLQ, but reports the pain radiating throughout her abdomen. Does report some nausea and denies vomiting.

## 2022-01-06 ENCOUNTER — Emergency Department (HOSPITAL_BASED_OUTPATIENT_CLINIC_OR_DEPARTMENT_OTHER): Payer: Medicare Other

## 2022-01-06 LAB — URINALYSIS, ROUTINE W REFLEX MICROSCOPIC
Bilirubin Urine: NEGATIVE
Glucose, UA: NEGATIVE mg/dL
Ketones, ur: 40 mg/dL — AB
Leukocytes,Ua: NEGATIVE
Nitrite: NEGATIVE
Specific Gravity, Urine: 1.021 (ref 1.005–1.030)
pH: 5.5 (ref 5.0–8.0)

## 2022-01-06 LAB — PREGNANCY, URINE: Preg Test, Ur: NEGATIVE

## 2022-01-06 LAB — OCCULT BLOOD X 1 CARD TO LAB, STOOL: Fecal Occult Bld: POSITIVE — AB

## 2022-01-06 MED ORDER — IOHEXOL 300 MG/ML  SOLN
100.0000 mL | Freq: Once | INTRAMUSCULAR | Status: AC | PRN
  Administered 2022-01-06: 100 mL via INTRAVENOUS

## 2022-01-06 MED ORDER — CIPROFLOXACIN HCL 500 MG PO TABS
500.0000 mg | ORAL_TABLET | Freq: Once | ORAL | Status: AC
  Administered 2022-01-06: 500 mg via ORAL
  Filled 2022-01-06 (×2): qty 1

## 2022-01-06 MED ORDER — METRONIDAZOLE 500 MG PO TABS
500.0000 mg | ORAL_TABLET | Freq: Three times a day (TID) | ORAL | 0 refills | Status: DC
Start: 2022-01-06 — End: 2022-01-23

## 2022-01-06 MED ORDER — METRONIDAZOLE 500 MG PO TABS
500.0000 mg | ORAL_TABLET | Freq: Once | ORAL | Status: AC
  Administered 2022-01-06: 500 mg via ORAL
  Filled 2022-01-06: qty 1

## 2022-01-06 MED ORDER — CIPROFLOXACIN HCL 500 MG PO TABS: 500.0000 mg | ORAL_TABLET | Freq: Two times a day (BID) | ORAL | 0 refills | Status: DC

## 2022-01-06 MED ORDER — SODIUM CHLORIDE 0.9 % IV BOLUS
1000.0000 mL | Freq: Once | INTRAVENOUS | Status: AC
  Administered 2022-01-06: 1000 mL via INTRAVENOUS

## 2022-01-06 NOTE — Discharge Instructions (Addendum)
Begin taking Cipro and Flagyl as prescribed.  Follow up with [facility] in the next few days.  Their contact information has been provided in this discharge summary for you to call to make these arrangements.  Return to the ER if you develop worsening pain, high fevers, dizziness, shortness of breath, or other new and concerning symptoms.

## 2022-01-06 NOTE — ED Provider Notes (Signed)
[facility] EMERGENCY DEPT Provider Note   CSN: [id number] Arrival date & time: 01/05/22  2034     History  Chief Complaint  Patient presents with   Diarrhea    Cristina Soto is a 51 y.o. female.  Patient is a 51 year old female with past medical history of anxiety, migraines.  Patient presenting today for evaluation of abdominal pain and diarrhea.  She describes a several day history of a "rumbling" in her abdomen, then began having diarrhea this morning.  She has had multiple episodes of maroon-colored diarrhea.  She is having some cramping to the left lower quadrant, but denies any fevers or chills.  She has felt nauseated, but denies vomiting.  She denies any ill contacts.  She describes eating raspberries several days ago, but has never had this issue with eating raspberries before.  The history is provided by the patient.      Home Medications Prior to Admission medications   Medication Sig Start Date End Date Taking? Authorizing Provider  Acetaminophen (TYLENOL) 325 MG CAPS Take by mouth.    [provider]  aspirin-acetaminophen-caffeine (EXCEDRIN MIGRAINE) [phone] MG tablet Take 2 tablets by mouth every 6 (six) hours as needed for headache.    [provider]  FLUoxetine (PROZAC) 10 MG capsule Take 1 capsule (10 mg total) by mouth daily. 12/19/21   Marny Lowenstein A, NP  Ibuprofen (ADVIL PO) Take by mouth.    [provider]  mupirocin ointment (BACTROBAN) 2 % Apply 1 application topically 2 (two) times daily. Patient not taking: Reported on 10/15/2021 08/16/20   Loman Brooklyn, FNP  SOOLANTRA 1 % CREA Apply topically daily. Patient not taking: Reported on 10/15/2021 05/24/21   [provider]      Allergies    Corticosteroids, Erythromycin, and Promethazine hcl    Review of Systems   Review of Systems  All other systems reviewed and are negative.  Physical Exam Updated Vital Signs BP 106/64    Pulse 88     Temp 99.1 F (37.3 C)    Resp 18    Ht 5\' 4"  (1.626 m)    Wt 61.2 kg    SpO2 100%    BMI 23.17 kg/m  Physical Exam Vitals and nursing note reviewed.  Constitutional:      General: She is not in acute distress.    Appearance: She is well-developed. She is not diaphoretic.  HENT:     Head: Normocephalic and atraumatic.  Cardiovascular:     Rate and Rhythm: Normal rate and regular rhythm.     Heart sounds: No murmur heard.   No friction rub. No gallop.  Pulmonary:     Effort: Pulmonary effort is normal. No respiratory distress.     Breath sounds: Normal breath sounds. No wheezing.  Abdominal:     General: Bowel sounds are normal. There is no distension.     Palpations: Abdomen is soft.     Tenderness: There is abdominal tenderness. There is no right CVA tenderness, left CVA tenderness, guarding or rebound.     Comments: There is tenderness to palpation in the left lower quadrant.  Musculoskeletal:        General: Normal range of motion.     Cervical back: Normal range of motion and neck supple.  Skin:    General: Skin is warm and dry.  Neurological:     General: No focal deficit present.     Mental Status: She is alert and oriented to  person, place, and time.    ED Results / Procedures / Treatments   Labs (all labs ordered are listed, but only abnormal results are displayed) Labs Reviewed  COMPREHENSIVE METABOLIC PANEL - Abnormal; Notable for the following components:      Result Value   Total Protein 6.3 (*)    All other components within normal limits  URINALYSIS, ROUTINE W REFLEX MICROSCOPIC - Abnormal; Notable for the following components:   Hgb urine dipstick SMALL (*)    Ketones, ur 40 (*)    Protein, ur TRACE (*)    Bacteria, UA MANY (*)    Crystals PRESENT (*)    All other components within normal limits  GASTROINTESTINAL PANEL BY PCR, STOOL (REPLACES STOOL CULTURE)  LIPASE, BLOOD  CBC  PREGNANCY, URINE  POC OCCULT BLOOD, ED    EKG None  Radiology No  results found.  Procedures Procedures    Medications Ordered in ED Medications - No data to display  ED Course/ Medical Decision Making/ A&P  This patient presents to the ED for concern of abdominal pain and bloody diarrhea, this involves an extensive number of treatment options, and is a complaint that carries with it a high risk of complications and morbidity.  The differential diagnosis includes colitis, diverticulitis, invasive gastroenteritis   Co morbidities that complicate the patient evaluation  None   Additional history obtained:  No additional history or external records obtained or needed   Lab Tests:  I Ordered, and personally interpreted labs.  The pertinent results include: Unremarkable CBC, comprehensive metabolic panel, and lipase.   Imaging Studies ordered:  I ordered imaging studies including CT scan of the abdomen and pelvis I independently visualized and interpreted imaging which showed colitis as described in the radiology report I agree with the radiologist interpretation   Cardiac Monitoring:  None   Medicines ordered and prescription drug management:  I ordered medication including Cipro and Flagyl for presumed infectious colitis Reevaluation of the patient after these medicines showed that the patient stayed the same I have reviewed the patients home medicines and have made adjustments as needed   Test Considered:  No other test considered, but I feel as though patient should likely undergo colonoscopy in the near future.  Patient has been referred to  GI to make these arrangements.   Critical Interventions:  None   Consultations Obtained:  No emergent referrals placed, but follow-up with gastroenterology strongly recommended   Problem List / ED Course:  Patient presenting here with bloody diarrhea that is heme positive.  CT scan reveals colitis with with wall thickening involving the right colon from the cecum to the  proximal transverse colon.  Patient to be treated with Cipro and Flagyl and is to follow-up with gastroenterology.  She has not yet had a colonoscopy and I feel this is indicated. Patient is afebrile, well-appearing, with stable vital signs and stable hemoglobin I feel as though she can be treated as an outpatient, with return as needed   Reevaluation:  After the interventions noted above, I reevaluated the patient and found that they have : Stayed the same   Social Determinants of Health:  None   Dispostion:  After consideration of the diagnostic results and the patients response to treatment, I feel that the patent would benefit from antibiotic therapy and outpatient follow-up with GI.    Final Clinical Impression(s) / ED Diagnoses Final diagnoses:  None    Rx / DC Orders ED Discharge Orders  None         Veryl Speak, MD 01/06/22 [phone]

## 2022-01-07 ENCOUNTER — Emergency Department (HOSPITAL_BASED_OUTPATIENT_CLINIC_OR_DEPARTMENT_OTHER)
Admission: EM | Admit: 2022-01-07 | Discharge: 2022-01-07 | Disposition: A | Discharge status: Home / Self Care | Payer: Medicare Other | Attending: Student | Admitting: Student

## 2022-01-07 ENCOUNTER — Telehealth (HOSPITAL_BASED_OUTPATIENT_CLINIC_OR_DEPARTMENT_OTHER): Payer: Self-pay | Admitting: Obstetrics and Gynecology

## 2022-01-07 ENCOUNTER — Other Ambulatory Visit: Payer: Self-pay

## 2022-01-07 ENCOUNTER — Encounter: Payer: Self-pay | Admitting: Physician Assistant

## 2022-01-07 ENCOUNTER — Other Ambulatory Visit (HOSPITAL_BASED_OUTPATIENT_CLINIC_OR_DEPARTMENT_OTHER): Payer: Self-pay

## 2022-01-07 DIAGNOSIS — R110 Nausea: Secondary | ICD-10-CM

## 2022-01-07 DIAGNOSIS — R112 Nausea with vomiting, unspecified: Secondary | ICD-10-CM | POA: Diagnosis present

## 2022-01-07 DIAGNOSIS — J45909 Unspecified asthma, uncomplicated: Secondary | ICD-10-CM | POA: Diagnosis not present

## 2022-01-07 DIAGNOSIS — K529 Noninfective gastroenteritis and colitis, unspecified: Secondary | ICD-10-CM | POA: Insufficient documentation

## 2022-01-07 DIAGNOSIS — Z7982 Long term (current) use of aspirin: Secondary | ICD-10-CM | POA: Diagnosis not present

## 2022-01-07 DIAGNOSIS — Z79899 Other long term (current) drug therapy: Secondary | ICD-10-CM | POA: Insufficient documentation

## 2022-01-07 LAB — CBC
HCT: 42.4 % (ref 36.0–46.0)
Hemoglobin: 14.2 g/dL (ref 12.0–15.0)
MCH: 28.9 pg (ref 26.0–34.0)
MCHC: 33.5 g/dL (ref 30.0–36.0)
MCV: 86.2 fL (ref 80.0–100.0)
Platelets: 196 10*3/uL (ref 150–400)
RBC: 4.92 MIL/uL (ref 3.87–5.11)
RDW: 12.6 % (ref 11.5–15.5)
WBC: 9.5 10*3/uL (ref 4.0–10.5)
nRBC: 0 % (ref 0.0–0.2)

## 2022-01-07 LAB — GASTROINTESTINAL PANEL BY PCR, STOOL (REPLACES STOOL CULTURE)

## 2022-01-07 LAB — COMPREHENSIVE METABOLIC PANEL
ALT: 22 U/L (ref 0–44)
AST: 17 U/L (ref 15–41)
Albumin: 3.7 g/dL (ref 3.5–5.0)
Alkaline Phosphatase: 51 U/L (ref 38–126)
Anion gap: 10 (ref 5–15)
BUN: 11 mg/dL (ref 6–20)
CO2: 24 mmol/L (ref 22–32)
Calcium: 9.1 mg/dL (ref 8.9–10.3)
Chloride: 104 mmol/L (ref 98–111)
Creatinine, Ser: 0.64 mg/dL (ref 0.44–1.00)
GFR, Estimated: >60 mL/min (ref >60)
Glucose, Bld: 117 mg/dL — ABNORMAL HIGH (ref 70–99)
Potassium: 3.5 mmol/L (ref 3.5–5.1)
Sodium: 138 mmol/L (ref 135–145)
Total Bilirubin: 0.7 mg/dL (ref 0.3–1.2)
Total Protein: 6.5 g/dL (ref 6.5–8.1)

## 2022-01-07 MED ORDER — ONDANSETRON 4 MG PO TBDP
4.0000 mg | ORAL_TABLET | Freq: Once | ORAL | Status: AC
Start: 2022-01-07 — End: 2022-01-07
  Administered 2022-01-07: 4 mg via ORAL
  Filled 2022-01-07: qty 1

## 2022-01-07 MED ORDER — ONDANSETRON 4 MG PO TBDP
4.0000 mg | ORAL_TABLET | Freq: Three times a day (TID) | ORAL | 0 refills | Status: DC | PRN
  Filled 2022-01-07: qty 20, 7d supply, fill #0

## 2022-01-07 MED ORDER — LACTATED RINGERS IV BOLUS
1000.0000 mL | Freq: Once | INTRAVENOUS | Status: AC
  Administered 2022-01-07: 1000 mL via INTRAVENOUS

## 2022-01-07 NOTE — ED Triage Notes (Signed)
Pt to ED via POV C/O bloody diarrhea x 3 days. Reports dark red and bright red blood. Recently scene for the same, RX  Cipro prescribed dx with colitis. Reports burning abdominal pain at times. Reports last bloody stool 1 hour ago.

## 2022-01-07 NOTE — ED Notes (Signed)
Pt ambulatory to bathroom without difficulty.  

## 2022-01-07 NOTE — ED Provider Notes (Signed)
[facility] EMERGENC[facility] DEPT Provider Note  CSN: [id number] Arrival date & time: 01/07/22 [id number]  Chief Complaint(s) Rectal Bleeding and Abdominal Pain  HPI Cristina Soto is a 51 y.o. female who presents emergency department for evaluation of rectal bleeding and nausea.  Patient was seen on 01/05/2022 for the same complaint and full work-up was performed including CT abdomen pelvis showing colitis.  Patient stool studies have not resulted.  She states that she feels dehydrated and is having difficulty taking her antibiotics and she has puking because of the nausea.  Denies chest pain, shortness of breath, abdominal pain, cough, fever or other systemic symptoms.   Rectal Bleeding Associated symptoms: vomiting   Abdominal Pain Associated symptoms: hematochezia, nausea and vomiting    Past Medical History Past Medical History:  Diagnosis Date   Anxiety    Asthma    Chronic fatigue fibromyalgia syndrome    CIN I (cervical intraepithelial neoplasia I) 2002   Depression    Fibromyalgia    Hemorrhoids    Migraine    Vulvar pruritus    Patient Active Problem List   Diagnosis Date Noted   Chronic sinusitis 11/13/2018   Hoarseness of voice 11/13/2018   Migraine 04/30/2017   Anxiety 04/30/2017   Constipation 04/30/2017   Excessive cerumen in both ear canals 05/02/2016   Referred otalgia of both ears 05/02/2016   Hemorrhoids 04/01/2012   Home Medication(s) Prior to Admission medications   Medication Sig Start Date End Date Taking? Authorizing Provider  ondansetron (ZOFRAN-ODT) 4 MG disintegrating tablet Take 1 tablet (4 mg total) by mouth every 8 (eight) hours as needed for nausea or vomiting. 01/07/22  Yes Makesha Belitz, MD  Acetaminophen (TYLENOL) 325 MG CAPS Take by mouth.    [provider]  aspirin-acetaminophen-caffeine (EXCEDRIN MIGRAINE) [id number] MG tablet Take 2 tablets by mouth every 6 (six) hours as needed for headache.    [provider]   ciprofloxacin (CIPRO) 500 MG tablet Take 1 tablet (500 mg total) by mouth 2 (two) times daily. One po bid x 7 days 01/06/22   Veryl Speak, MD  FLUoxetine (PROZAC) 10 MG capsule Take 1 capsule (10 mg total) by mouth daily. 12/19/21   Marny Lowenstein A, NP  Ibuprofen (ADVIL PO) Take by mouth.    [provider]  metroNIDAZOLE (FLAGYL) 500 MG tablet Take 1 tablet (500 mg total) by mouth 3 (three) times daily. One po tid x 7 days 01/06/22   Veryl Speak, MD  mupirocin ointment (BACTROBAN) 2 % Apply 1 application topically 2 (two) times daily. Patient not taking: Reported on 10/15/2021 08/16/20   Loman Brooklyn, FNP  SOOLANTRA 1 % CREA Apply topically daily. Patient not taking: Reported on 10/15/2021 05/24/21   [provider]  Past Surgical History Past Surgical History:  Procedure Laterality Date   COLPOSCOPY  2002   DILATION AND CURETTAGE OF UTERUS  1997   TH.AB   Family History Family History  Problem Relation Age of Onset   Cancer Paternal Grandmother        Lung   Cancer Paternal Grandfather        Liver   Diverticulitis Father    Diverticulitis Sister    Alcohol abuse Sister    Asthma Mother    Colon cancer Neg Hx    Breast cancer Neg Hx     Social History Social History   Tobacco Use   Smoking status: Never   Smokeless tobacco: Never  Vaping Use   Vaping Use: Never used  Substance Use Topics   Alcohol use: Not Currently    Alcohol/week: 0.0 standard drinks   Drug use: No   Allergies Corticosteroids, Erythromycin, and Promethazine hcl  Review of Systems Review of Systems  Gastrointestinal:  Positive for blood in stool, hematochezia, nausea and vomiting.   Physical Exam Vital Signs  I have reviewed the triage vital signs BP 100/79    Pulse 69    Temp 98.9 F (37.2 C) (Oral)    Resp 12    Ht 5\' 4"  (1.626 m)    Wt  61.2 kg    SpO2 100%    BMI 23.17 kg/m   Physical Exam Vitals and nursing note reviewed.  Constitutional:      General: She is not in acute distress.    Appearance: She is well-developed.  HENT:     Head: Normocephalic and atraumatic.  Eyes:     Conjunctiva/sclera: Conjunctivae normal.  Cardiovascular:     Rate and Rhythm: Normal rate and regular rhythm.     Heart sounds: No murmur heard. Pulmonary:     Effort: Pulmonary effort is normal. No respiratory distress.     Breath sounds: Normal breath sounds.  Abdominal:     Palpations: Abdomen is soft.     Tenderness: There is no abdominal tenderness.  Musculoskeletal:        General: No swelling.     Cervical back: Neck supple.  Skin:    General: Skin is warm and dry.     Capillary Refill: Capillary refill takes less than 2 seconds.  Neurological:     Mental Status: She is alert.  Psychiatric:        Mood and Affect: Mood normal.    ED Results and Treatments Labs (all labs ordered are listed, but only abnormal results are displayed) Labs Reviewed  COMPREHENSIVE METABOLIC PANEL - Abnormal; Notable for the following components:      Result Value   Glucose, Bld 117 (*)    All other components within normal limits  CBC  Radiology No results found.  Pertinent labs & imaging results that were available during my care of the patient were reviewed by me and considered in my medical decision making (see MDM for details).  Medications Ordered in ED Medications  lactated ringers bolus 1,000 mL (1,000 mLs Intravenous New Bag/Given 01/07/22 0934)  ondansetron (ZOFRAN-ODT) disintegrating tablet 4 mg (4 mg Oral Given 01/07/22 0931)                                                                                                                                     Procedures Procedures  (including critical care  time)  Medical Decision Making / ED Course   This patient presents to the ED for concern of rectal bleeding, nausea, this involves an extensive number of treatment options, and is a complaint that carries with it a high risk of complications and morbidity.  The differential diagnosis includes decreased p.o. intake secondary to nausea, medication side effect from antibiotics, persistent colitis, C. difficile colitis, ischemic colitis  MDM: Number patient seen emergency department for evaluation of rectal bleeding and nausea.  Physical exam largely unremarkable.  Rectal exam deferred as she had an exam 2 days ago and found to already be Hemoccult positive.  Laboratory evaluation unremarkable.  Patient given ODT Zofran and 1 L lactated Ringer's and on reevaluation her symptoms have improved dramatically.  Patient's presentation likely multifactorial with dehydration secondary to decreased p.o. intake, medication side effect from the Cipro Flagyl.  She will be discharged on ODT Zofran and encouraged to push oral hydration.  I called and spoke with the lab about the patient's stool studies and they still have not been sent to [facility] for testing.  I encouraged them to speed this testing up as we may be able to prevent further ER visits but the results from these tests.   Additional history obtained: -Additional history obtained from parents -External records from outside source obtained and reviewed including: Chart review including previous notes, labs, imaging, consultation notes   Lab Tests: -I ordered, reviewed, and interpreted labs.   The pertinent results include:   Labs Reviewed  COMPREHENSIVE METABOLIC PANEL - Abnormal; Notable for the following components:      Result Value   Glucose, Bld 117 (*)    All other components within normal limits  CBC       Medicines ordered and prescription drug management: Meds ordered this encounter  Medications   lactated ringers bolus 1,000 mL    ondansetron (ZOFRAN-ODT) disintegrating tablet 4 mg   ondansetron (ZOFRAN-ODT) 4 MG disintegrating tablet    Sig: Take 1 tablet (4 mg total) by mouth every 8 (eight) hours as needed for nausea or vomiting.    Dispense:  20 tablet    Refill:  0    -I have reviewed the patients home medicines and have made adjustments as needed  Critical interventions none   Cardiac Monitoring: The  patient was maintained on a cardiac monitor.  I personally viewed and interpreted the cardiac monitored which showed an underlying rhythm of: NSR  Social Determinants of Health:  Factors impacting patients care include: none   Reevaluation: After the interventions noted above, I reevaluated the patient and found that they have :improved  Co morbidities that complicate the patient evaluation  Past Medical History:  Diagnosis Date   Anxiety    Asthma    Chronic fatigue fibromyalgia syndrome    CIN I (cervical intraepithelial neoplasia I) 2002   Depression    Fibromyalgia    Hemorrhoids    Migraine    Vulvar pruritus       Dispostion: I considered admission for this patient, but with symptoms controlled and stable vital signs patient safe for discharge with outpatient follow-up     Final Clinical Impression(s) / ED Diagnoses Final diagnoses:  Colitis  Nausea     @PCDICTATION @    Teressa Lower, MD 01/07/22 1045

## 2022-01-07 NOTE — ED Notes (Signed)
Delay in discharge d/t  completion of fluid bolus. Pt d/c home per MD order. Discharge summary reviewed, pt verbalizes understanding. Ambulatory off unit. No s/s of acute distress noted. Pt discharged home with family.

## 2022-01-07 NOTE — Telephone Encounter (Signed)
Patient called to discuss results of GI Panel on the phone. Results were reviewed by EDP- MD Tegeler who stated patient should stop taking antibiotics at this time and hydrate aggressively. Patient instructed to stop taking her current antibiotics of Cipro and Flagyl. Patient reported understanding. Encouraged to clean bathrooms with bleach and to maintain good hand hygiene with family around. Patient encouraged to try Pedialyte and/or sugar free Gatorade and to aim for 8-10 glasses of water while sick to stay hydrated. Patient endorses understanding and is enthusiastic about following plan of care to feel better. Encouraged a bland diet while recovering from this illness and to decrease acidic and potentially gut irritating foods.   If patient is not feeling better in 7-10 days, MD encourages patient to follow up for a re-check of stool.

## 2022-01-09 ENCOUNTER — Encounter: Payer: Medicare Other | Admitting: Psychology

## 2022-01-16 ENCOUNTER — Other Ambulatory Visit: Payer: Self-pay | Admitting: Nurse Practitioner

## 2022-01-16 DIAGNOSIS — F410 Panic disorder [episodic paroxysmal anxiety] without agoraphobia: Secondary | ICD-10-CM

## 2022-01-16 NOTE — Telephone Encounter (Signed)
Pharmacy requesting 90 day Rx.  ?As of 01/03/22, pt reported not starting the meds yet. Pt supposed to f/u in 6 weeks after starting. Please advise.  ?

## 2022-01-17 ENCOUNTER — Ambulatory Visit: Payer: Medicare Other | Admitting: Physician Assistant

## 2022-01-22 ENCOUNTER — Encounter: Payer: Medicare Other | Admitting: Psychology

## 2022-01-22 ENCOUNTER — Other Ambulatory Visit: Payer: Self-pay

## 2022-01-23 ENCOUNTER — Encounter: Payer: Self-pay | Admitting: Family Medicine

## 2022-01-23 ENCOUNTER — Ambulatory Visit (INDEPENDENT_AMBULATORY_CARE_PROVIDER_SITE_OTHER): Payer: Medicare Other | Admitting: Family Medicine

## 2022-01-23 VITALS — BP 103/63 | HR 88 | Wt 133.0 lb

## 2022-01-23 DIAGNOSIS — A498 Other bacterial infections of unspecified site: Secondary | ICD-10-CM | POA: Diagnosis not present

## 2022-01-23 NOTE — Progress Notes (Signed)
? ?BP 103/63   Pulse 88   Wt 133 lb (60.3 kg)   SpO2 98%   BMI 22.83 kg/m?   ? ?Subjective:  ? ?Patient ID: Cristina Soto, female    DOB: 27-Oct-1971, 51 y.o.   MRN: [id number] ? ?HPI: ?Cristina Soto is a 51 y.o. female presenting on 01/23/2022 for Nausea and Recent E. Coli infection (Denies blood in stool. Still soft but not lose) ? ? ?HPI ?Patient is coming in today with with recent E. coli infection.  She was feeling better but she is having fewer symptoms.  She did get an antibiotic initially which sent her back to the hospital and what sounds like hemolytic uremic syndrome.  She was having a lot of bloody stool.  She is not having any more.  She does have a little bit of dark stools.  She is getting more grumbling and a little bit of nausea and a little bit of diarrhea still, had a few cases this morning.  She denies any fevers or chills.  She denies any lightheadedness or dizziness or abdominal pain. ? ?Relevant past medical, surgical, family and social history reviewed and updated as indicated. Interim medical history since our last visit reviewed. ?Allergies and medications reviewed and updated. ? ?Review of Systems  ?Constitutional:  Negative for chills and fever.  ?Eyes:  Negative for visual disturbance.  ?Respiratory:  Negative for chest tightness and shortness of breath.   ?Cardiovascular:  Negative for chest pain and leg swelling.  ?Gastrointestinal:  Positive for abdominal pain, diarrhea and nausea. Negative for blood in stool, constipation and vomiting.  ?Musculoskeletal:  Negative for back pain and gait problem.  ?Skin:  Negative for rash.  ?Neurological:  Negative for dizziness, light-headedness and headaches.  ?Psychiatric/Behavioral:  Negative for agitation and behavioral problems.   ?All other systems reviewed and are negative. ? ?Per HPI unless specifically indicated above ? ? ?Allergies as of 01/23/2022   ? ?   Reactions  ? Corticosteroids Nausea And Vomiting  ? Erythromycin Nausea And  Vomiting  ? Promethazine Hcl Nausea And Vomiting  ? Phenergan  ? ?  ? ?  ?Medication List  ?  ? ?  ? Accurate as of January 23, 2022  3:21 PM. If you have any questions, ask your nurse or doctor.  ?  ?  ? ?  ? ?STOP taking these medications   ? ?ciprofloxacin 500 MG tablet ?Commonly known as: Cipro ?Stopped by: Worthy Rancher, MD ?  ?metroNIDAZOLE 500 MG tablet ?Commonly known as: Flagyl ?Stopped by: Worthy Rancher, MD ?  ?Soolantra 1 % Crea ?Generic drug: Ivermectin ?Stopped by: Worthy Rancher, MD ?  ? ?  ? ?TAKE these medications   ? ?ADVIL PO ?Take by mouth. ?  ?aspirin-acetaminophen-caffeine 250-250-65 MG tablet ?Commonly known as: [place] ?Take 2 tablets by mouth every 6 (six) hours as needed for headache. ?  ?FLUoxetine 10 MG capsule ?Commonly known as: PROzac ?Take 1 capsule (10 mg total) by mouth daily. ?  ?mupirocin ointment 2 % ?Commonly known as: Bactroban ?Apply 1 application topically 2 (two) times daily. ?  ?ondansetron 4 MG disintegrating tablet ?Commonly known as: ZOFRAN-ODT ?Take 1 tablet (4 mg total) by mouth every 8 (eight) hours as needed for nausea or vomiting. ?  ?Tylenol 325 MG Caps ?Generic drug: Acetaminophen ?Take by mouth. ?  ? ?  ? ? ? ?Objective:  ? ?BP 103/63   Pulse 88   Wt 133 lb (60.3  kg)   SpO2 98%   BMI 22.83 kg/m?   ?Wt Readings from Last 3 Encounters:  ?01/23/22 133 lb (60.3 kg)  ?01/07/22 135 lb (61.2 kg)  ?01/05/22 135 lb (61.2 kg)  ?  ?Physical Exam ?Vitals and nursing note reviewed.  ?Constitutional:   ?   General: She is not in acute distress. ?   Appearance: She is well-developed. She is not diaphoretic.  ?Eyes:  ?   Conjunctiva/sclera: Conjunctivae normal.  ?Cardiovascular:  ?   Rate and Rhythm: Normal rate and regular rhythm.  ?   Heart sounds: Normal heart sounds. No murmur heard. ?Pulmonary:  ?   Effort: Pulmonary effort is normal. No respiratory distress.  ?   Breath sounds: Normal breath sounds. No wheezing.  ?Abdominal:  ?   General: Abdomen  is flat. Bowel sounds are normal. There is no distension.  ?   Palpations: Abdomen is soft.  ?   Tenderness: There is no abdominal tenderness. There is no right CVA tenderness, left CVA tenderness, guarding or rebound.  ?Musculoskeletal:     ?   General: No swelling or tenderness. Normal range of motion.  ?Skin: ?   General: Skin is warm and dry.  ?   Findings: No rash.  ?Neurological:  ?   Mental Status: She is alert and oriented to person, place, and time.  ?   Coordination: Coordination normal.  ?Psychiatric:     ?   Behavior: Behavior normal.  ? ? ? ? ?Assessment & Plan:  ? ?Problem List Items Addressed This Visit   ?None ?Visit Diagnoses   ? ? Shiga toxin-producing Escherichia coli O157:H7    -  Primary  ? Relevant Orders  ? CBC with Differential/Platelet  ? CMP14+EGFR  ? Stool culture  ? ?  ?  ?Patient was diagnosed with Shiga toxin producing E. coli infection in the emergency department and given antibiotic it first time but then the second time she was found and stopped an antibiotic.  She was in the ER for a little bit getting fluids but has been better.  She does still have some nausea and is still having diarrhea, symptoms have improved up until this morning she did have a little more symptoms.  ? ?Conservative management ?Follow up plan: ?Return if symptoms worsen or fail to improve. ? ?Counseling provided for all of the vaccine components ?Orders Placed This Encounter  ?Procedures  ? Stool culture  ? CBC with Differential/Platelet  ? CMP14+EGFR  ? ? ?Caryl Pina, MD ?[place] ?01/23/2022, 3:21 PM ? ? ? ? ?

## 2022-01-24 ENCOUNTER — Other Ambulatory Visit: Payer: Self-pay

## 2022-01-24 ENCOUNTER — Encounter: Payer: Medicare Other | Attending: Psychology | Admitting: Psychology

## 2022-01-24 ENCOUNTER — Encounter: Payer: Self-pay | Admitting: Nurse Practitioner

## 2022-01-24 DIAGNOSIS — G43001 Migraine without aura, not intractable, with status migrainosus: Secondary | ICD-10-CM | POA: Diagnosis present

## 2022-01-24 DIAGNOSIS — F0630 Mood disorder due to known physiological condition, unspecified: Secondary | ICD-10-CM | POA: Insufficient documentation

## 2022-01-24 DIAGNOSIS — F064 Anxiety disorder due to known physiological condition: Secondary | ICD-10-CM | POA: Diagnosis present

## 2022-01-24 LAB — CBC WITH DIFFERENTIAL/PLATELET
Basophils Absolute: 0 10*3/uL (ref 0.0–0.2)
Basos: 0 %
EOS (ABSOLUTE): 0 10*3/uL (ref 0.0–0.4)
Eos: 1 %
Hematocrit: 41.6 % (ref 34.0–46.6)
Hemoglobin: 14.1 g/dL (ref 11.1–15.9)
Immature Grans (Abs): 0 10*3/uL (ref 0.0–0.1)
Immature Granulocytes: 0 %
Lymphocytes Absolute: 0.7 10*3/uL (ref 0.7–3.1)
Lymphs: 9 %
MCH: 29.7 pg (ref 26.6–33.0)
MCHC: 33.9 g/dL (ref 31.5–35.7)
MCV: 88 fL (ref 79–97)
Monocytes Absolute: 0.5 10*3/uL (ref 0.1–0.9)
Monocytes: 7 %
Neutrophils Absolute: 5.9 10*3/uL (ref 1.4–7.0)
Neutrophils: 83 %
Platelets: 305 10*3/uL (ref 150–450)
RBC: 4.74 x10E6/uL (ref 3.77–5.28)
RDW: 12.1 % (ref 11.7–15.4)
WBC: 7.1 10*3/uL (ref 3.4–10.8)

## 2022-01-24 LAB — CMP14+EGFR
ALT: 12 IU/L (ref 0–32)
AST: 13 IU/L (ref 0–40)
Albumin/Globulin Ratio: 1.6 (ref 1.2–2.2)
Albumin: 4.2 g/dL (ref 3.8–4.9)
Alkaline Phosphatase: 74 IU/L (ref 44–121)
BUN/Creatinine Ratio: 14 (ref 9–23)
BUN: 10 mg/dL (ref 6–24)
Bilirubin Total: 0.5 mg/dL (ref 0.0–1.2)
CO2: 25 mmol/L (ref 20–29)
Calcium: 9.9 mg/dL (ref 8.7–10.2)
Chloride: 104 mmol/L (ref 96–106)
Creatinine, Ser: 0.74 mg/dL (ref 0.57–1.00)
Globulin, Total: 2.6 g/dL (ref 1.5–4.5)
Glucose: 105 mg/dL — ABNORMAL HIGH (ref 70–99)
Potassium: 4.3 mmol/L (ref 3.5–5.2)
Sodium: 144 mmol/L (ref 134–144)
Total Protein: 6.8 g/dL (ref 6.0–8.5)
eGFR: 98 mL/min/{1.73_m2} (ref >59)

## 2022-01-25 NOTE — Telephone Encounter (Signed)
Spoke with ML and she reports that there is no way to prescribe a 5mg  so if the pt desires to take her 10mg s and split in half to start off she can do that. Pt notified and voiced understanding.  ?

## 2022-01-26 ENCOUNTER — Encounter: Payer: Self-pay | Admitting: Obstetrics & Gynecology

## 2022-01-27 ENCOUNTER — Encounter: Payer: Self-pay | Admitting: Family Medicine

## 2022-01-28 ENCOUNTER — Encounter: Payer: Self-pay | Admitting: Psychology

## 2022-01-28 ENCOUNTER — Telehealth: Payer: Self-pay | Admitting: Family Medicine

## 2022-01-28 ENCOUNTER — Other Ambulatory Visit: Payer: Self-pay | Admitting: Nurse Practitioner

## 2022-01-28 DIAGNOSIS — R040 Epistaxis: Secondary | ICD-10-CM

## 2022-01-28 DIAGNOSIS — F411 Generalized anxiety disorder: Secondary | ICD-10-CM

## 2022-01-28 DIAGNOSIS — A498 Other bacterial infections of unspecified site: Secondary | ICD-10-CM

## 2022-01-28 MED ORDER — FLUOXETINE HCL 10 MG PO TABS: 10.0000 mg | ORAL_TABLET | Freq: Every day | ORAL | 1 refills | Status: DC
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
## 2022-01-28 NOTE — Telephone Encounter (Signed)
tell her to use nasal saline sprays every day just in case his allergies and if the nosebleeds persist have her come in and do another CBC to make sure that her platelets and blood counts are looking good ?

## 2022-01-28 NOTE — Telephone Encounter (Signed)
Pt made aware in mychart message. CBC, BMP ordered ?

## 2022-01-28 NOTE — Telephone Encounter (Signed)
Patient calling in to make sure that Dr. Louanne Skye is aware of her having nose bleeds after the rare strain of E Coli that she had. Please call back.  ?

## 2022-01-29 ENCOUNTER — Telehealth: Payer: Self-pay

## 2022-01-29 NOTE — Telephone Encounter (Signed)
Faxed last OV note and recent labs to [facility] of E.Coli Shiga toxin. ?

## 2022-01-30 NOTE — Progress Notes (Signed)
? ? ? ?01/31/2022 ?Cristina Soto ?[id number] ?1971-03-22 ? ? ?ASSESSMENT AND PLAN:  ? ?History of diarrhea ?Had shiga toxin Ecoli ?Has since resolved some lower AB pain/soft stools versus constipation- No diarrhea. Will repeat labs but likely post infectious IBS.  ?See AVS for information provided to the patient ?-     CBC with Differential/Platelet; Future ?-     Comprehensive metabolic panel; Future ?-     Tissue transglutaminase, IgA; Future ?-     IgA; Future ? ?Screen for colon cancer ?Discussed evaluating directly with colonoscopy, patient declines colonoscopy, no family history, has cologuard at home.  ?Discussed with patient false positive and understands if positive will need colonoscopy.  ?Follow up 3 months.  ? ?Patient Care Team: ?Dettinger, Elige Radon, MD as PCP - General (Family Medicine) ? ?HISTORY OF PRESENT ILLNESS: ?51 y.o. female referred by Dettinger, Elige Radon, MD, with a past medical history of depression/anxiety, asthma, fibromyalgia, hemorrhoids, migraine and others listed below presents for evaluation of colitis.  ?Patient went to the ER with left lower quadrant abdominal pain, hematochezia, given Cipro 500 and Flagyl.   ?Patient had no anemia or leukocytosis during that visit. ?Normal kidney and liver function. ?01/06/2022 CT abdomen pelvis with contrast abdominal pain left lower quadrant abdominal pain, hematochezia showed wall thickening right colon from cecum to proximal transverse colon compatible with infectious versus inflammatory colitis.  Patient had normal liver, gallbladder unremarkable, normal pancreas, normal spleen. ? 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  ?She states that Friday she had loose stools then Saturday all day she was going to the bathroom every hour with blood clots and  ?She went to the ER, gave cipro/flagyl which made it worse.  ?She went back to the ER and found to have Ecoli  shiga toxin producing. Given IV fluids, has been doing bland diet and doing better.  ? ?Then last Monday (10 days) she states she had very loose, "soft serve" icec ream, jet black stool. She had 5 BM's on Monday. No hematochezia at that time. No nocturnal symptoms.  ?AB cramping prior to BM, better after BM.  ?She had nausea and headache, could not eat or drink Monday or Tuesday. Marland Kitchen  ?Then she has hard stools and more constipation symptoms, no further diarrhea. Continues to have some mild cramping/AB discomfort.  ?She had a cold, took claritin and advil took last week. Has had some blood with her snot.  ?Went to functional medicine doctor and got IV fluids with B12 vitamins.  ?She has cologuard at home.  ?She has no family history of colon cancer.  ?Has family history of diverticula.  ? ?CBC  01/23/2022  ?HGB 14.1 MCV 88 without evidence of anemia ?WBC 7.1 Platelets 305 ?04/04/2021  B12 283  ?Kidney function 01/23/2022  ?BUN 10 Cr 0.74  ?GFR >60  ?Potassium 4.3   ?LFTs 01/23/2022  ?AST 13 ALT 12 ?Alkphos 74 TBili 0.5 ?01/05/2022 LIPASE 28 ?10/24/2020 TSH 3.10 ? ?Current Medications:  ? ? ? ? ?Current Outpatient Medications (Analgesics):  ?  Acetaminophen (TYLENOL) 325 MG CAPS, Take by mouth as needed. ?  aspirin-acetaminophen-caffeine (EXCEDRIN MIGRAINE) 250-250-65 MG tablet, Take 2 tablets by mouth as needed for headache. ?  Ibuprofen (ADVIL PO), Take by mouth as needed. ? ? ?Current Outpatient Medications (Other):  ?  FLUoxetine (PROZAC) 10 MG tablet, Take 1 tablet (10 mg total) by mouth daily. ?  ondansetron (ZOFRAN-ODT) 4 MG disintegrating tablet, Take 1 tablet (4 mg total) by mouth every 8 (eight) hours as needed for nausea or vomiting. ? ?Medical History:  ?Past Medical History:  ?Diagnosis Date  ? Anxiety   ? Asthma   ? Chronic fatigue fibromyalgia syndrome   ? CIN I (cervical intraepithelial neoplasia I) 2002  ? Depression   ? Fibromyalgia   ? Hemorrhoids   ? Migraine   ? Vulvar pruritus   ? ?Allergies:   ?Allergies  ?Allergen Reactions  ? Corticosteroids Nausea And Vomiting  ? Erythromycin Nausea And Vomiting  ? Promethazine Hcl Nausea And Vomiting  ?  Phenergan  ?  ? ?Surgical History:  ?She  has a past surgical history that includes Dilation and curettage of uterus (1997) and Colposcopy (2002). ?Family History:  ?Her family history includes Alcohol abuse in her sister; Asthma in her mother; Cancer in her paternal grandfather and paternal grandmother; Diverticulitis in her father and sister. ?Social History:  ? reports that she has never smoked. She has never used smokeless tobacco. She reports that she does not currently use alcohol. She reports that she does not use drugs. ? ?REVIEW OF SYSTEMS  : All other systems reviewed and negative except where noted in the History of Present Illness. ? ? ?PHYSICAL EXAM: ?BP 110/74   Pulse 85   Ht 5\' 4"  (1.626 m)   Wt 134 lb 3.2 oz (60.9 kg)   SpO2 98%   BMI 23.04 kg/m?  ?General:   Pleasant, well developed female in no acute distress ?Head:  Normocephalic and atraumatic. ?Eyes: sclerae anicteric,conjunctive pink  ?Heart:  regular rate and rhythm ?Pulm: Clear anteriorly; no wheezing ?Abdomen:  Soft, Flat AB, skin exam normal, Normal bowel sounds. mild tenderness in the lower abdomen. Without guarding and Without rebound, without hepatomegaly. ?Extremities:  Without edema. ?Msk:  Symmetrical without gross deformities. Peripheral pulses intact.  ?Neurologic:  Alert and  oriented x4;  grossly normal neurologically. ?Skin:   Dry and intact without significant lesions or rashes. ?Psychiatric: Demonstrates good judgement and reason without abnormal affect or behaviors. ? ? ? , PA-C ?2:59 PM ? ? ?

## 2022-01-31 ENCOUNTER — Encounter: Payer: Self-pay | Admitting: Physician Assistant

## 2022-01-31 ENCOUNTER — Ambulatory Visit: Payer: Medicare Other | Admitting: Physician Assistant

## 2022-01-31 VITALS — BP 110/74 | HR 85 | Ht 64.0 in | Wt 134.2 lb

## 2022-01-31 DIAGNOSIS — Z1211 Encounter for screening for malignant neoplasm of colon: Secondary | ICD-10-CM

## 2022-01-31 DIAGNOSIS — Z87898 Personal history of other specified conditions: Secondary | ICD-10-CM | POA: Diagnosis not present

## 2022-01-31 DIAGNOSIS — R1030 Lower abdominal pain, unspecified: Secondary | ICD-10-CM | POA: Diagnosis not present

## 2022-01-31 NOTE — Patient Instructions (Addendum)
Your provider has requested that you go to the basement level for lab work before leaving today. Press "B" on the elevator. The lab is located at the first door on the left as you exit the elevator. ? ? ?Likely post infectious IBS- see below.  ? ?Please try low FODMAP diet ?Try trial off milk/lactose products.  ?Add fiber like benefiber or citracel once a day ?Increase activity ?Can do fermented foods and add probiotic x 1 month ?Can do trial of IBGard for AB pain- Take 1-2 capsules once a day for maintence or twice a day during a flare ?Please try to decrease stress. ?if any worsening symptoms like blood in stool, weight loss, please call the office or go to the ER.  ? ?Irritable Bowel Syndrome, Adult ?Irritable bowel syndrome (IBS) is a group of symptoms that affects the organs responsible for digestion (gastrointestinal or GI tract). IBS is not one specific disease. ?To regulate how the GI tract works, the body sends signals back and forth between the intestines and the brain. If you have IBS, there may be a problem with these signals. As a result, the GI tract does not function normally. The intestines may become more sensitive and overreact to certain things. This may be especially true when you eat certain foods or when you are under stress. ?There are four types of IBS. These may be determined based on the consistency of your stool (feces): ?IBS with diarrhea. ?IBS with constipation. ?Mixed IBS. ?Unsubtyped IBS. ?It is important to know which type of IBS you have. Certain treatments are more likely to be helpful for certain types of IBS. ?What are the causes? ?The exact cause of IBS is not known. ?What increases the risk? ?You may have a higher risk for IBS if you: ?Are female. ?Are younger than 29. ?Have a family history of IBS. ?Have a mental health condition, such as depression, anxiety, or post-traumatic stress disorder. ?Have had a bacterial infection of your GI tract. ?What are the signs or  symptoms? ?Symptoms of IBS vary from person to person. The main symptom is abdominal pain or discomfort. Other symptoms usually include one or more of the following: ?Diarrhea, constipation, or both. ?Abdominal swelling or bloating. ?Feeling full after eating a small or regular-sized meal. ?Frequent gas. ?Mucus in the stool. ?A feeling of having more stool left after a bowel movement. ?Symptoms tend to come and go. They may be triggered by stress, mental health conditions, or certain foods. ?How is this diagnosed? ?This condition may be diagnosed based on a physical exam, your medical history, and your symptoms. You may have tests, such as: ?Blood tests. ?Stool test. ?X-rays. ?CT scan. ?Colonoscopy. This is a procedure in which your GI tract is viewed with a long, thin, flexible tube. ?How is this treated? ?There is no cure for IBS, but treatment can help relieve symptoms. Treatment depends on the type of IBS you have, and may include: ?Changes to your diet, such as: ?Avoiding foods that cause symptoms. ?Drinking more water. ?Following a low-FODMAP (fermentable oligosaccharides, disaccharides, monosaccharides, and polyols) diet for up to 6 weeks, or as told by your health care provider. FODMAPs are sugars that are hard for some people to digest. ?Eating more fiber. ?Eating medium-sized meals at the same times every day. ?Medicines. These may include: ?Fiber supplements, if you have constipation. ?Medicine to control diarrhea (antidiarrheal medicines). ?Medicine to help control muscle tightening (spasms) in your GI tract (antispasmodic medicines). ?Medicines to help with mental health conditions,  such as antidepressants or tranquilizers. ?Talk therapy or counseling. ?Working with a diet and nutrition specialist (dietitian) to help create a food plan that is right for you. ?Managing your stress. ?Follow these instructions at home: ?Eating and drinking ?Eat a healthy diet. ?Eat medium-sized meals at about the same time  every day. Do not eat large meals. ?Gradually eat more fiber-rich foods. These include whole grains, fruits, and vegetables. This may be especially helpful if you have IBS with constipation. ?Eat a diet low in FODMAPs. ?Drink enough fluid to keep your urine pale yellow. ?Keep a journal of foods that seem to trigger symptoms. ?Avoid foods and drinks that: ?Contain added sugar. ?Make your symptoms worse. Dairy products, caffeinated drinks, and carbonated drinks can make symptoms worse for some people. ?General instructions ?Take over-the-counter and prescription medicines and supplements only as told by your health care provider. ?Get enough exercise. Do at least 150 minutes of moderate-intensity exercise each week. ?Manage your stress. Getting enough sleep and exercise can help you manage stress. ?Keep all follow-up visits as told by your health care provider and therapist. This is important. ?Alcohol Use ?Do not drink alcohol if: ?Your health care provider tells you not to drink. ?You are pregnant, may be pregnant, or are planning to become pregnant. ?If you drink alcohol, limit how much you have: ?0-1 drink a day for women. ?0-2 drinks a day for men. ?Be aware of how much alcohol is in your drink. In the [place] one drink equals one typical bottle of beer (12 oz), one-half glass of wine (5 oz), or one shot of hard liquor (1? oz). ?Contact a health care provider if you have: ?Constant pain. ?Weight loss. ?Difficulty or pain when swallowing. ?Diarrhea that gets worse. ?Get help right away if you have: ?Severe abdominal pain. ?Fever. ?Diarrhea with symptoms of dehydration, such as dizziness or dry mouth. ?Bright red blood in your stool. ?Stool that is black and tarry. ?Abdominal swelling. ?Vomiting that does not stop. ?Blood in your vomit. ?Summary ?Irritable bowel syndrome (IBS) is not one specific disease. It is a group of symptoms that affects digestion. ?Your intestines may become more sensitive and overreact to  certain things. This may be especially true when you eat certain foods or when you are under stress. ?There is no cure for IBS, but treatment can help relieve symptoms. ?This information is not intended to replace advice given to you by your health care provider. Make sure you discuss any questions you have with your health care provider. ?Document Revised: 06/29/2020 Document Reviewed: 06/29/2020 ?Elsevier Patient Education ? 2022 [employer] ? ? ? ? ? ? ?

## 2022-02-01 NOTE — Progress Notes (Signed)
Addendum: Reviewed and agree with assessment and management plan. Rayden Dock M, MD  

## 2022-02-22 ENCOUNTER — Other Ambulatory Visit: Payer: Self-pay | Admitting: Nurse Practitioner

## 2022-02-22 DIAGNOSIS — F410 Panic disorder [episodic paroxysmal anxiety] without agoraphobia: Secondary | ICD-10-CM

## 2022-03-13 ENCOUNTER — Encounter: Payer: Medicare Other | Attending: Psychology | Admitting: Psychology

## 2022-03-13 DIAGNOSIS — F0630 Mood disorder due to known physiological condition, unspecified: Secondary | ICD-10-CM | POA: Insufficient documentation

## 2022-03-13 DIAGNOSIS — F064 Anxiety disorder due to known physiological condition: Secondary | ICD-10-CM | POA: Diagnosis not present

## 2022-03-13 DIAGNOSIS — G43001 Migraine without aura, not intractable, with status migrainosus: Secondary | ICD-10-CM | POA: Insufficient documentation

## 2022-03-25 ENCOUNTER — Encounter: Payer: Self-pay | Admitting: Psychology
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
## 2022-04-01 ENCOUNTER — Telehealth: Payer: Self-pay | Admitting: Family Medicine

## 2022-04-01 DIAGNOSIS — R197 Diarrhea, unspecified: Secondary | ICD-10-CM

## 2022-04-01 DIAGNOSIS — A498 Other bacterial infections of unspecified site: Secondary | ICD-10-CM

## 2022-04-01 NOTE — Telephone Encounter (Signed)
REFERRAL REQUEST Telephone Note  Have you been seen at our office for this problem? Yes per pt (Advise that they may need an appointment with their PCP before a referral can be done)  Reason for Referral: not sure Referral discussed with patient: not sure  Best contact number of patient for referral team: [phone]    Has patient been seen by a specialist for this issue before: yes  Patient provider preference for referral: Dr Blanchie Serve Patient location preference for referral: [place]   Patient notified that referrals can take up to a week or longer to process. If they haven't heard anything within a week they should call back and speak with the referral department.    Dettinger's pt  Please call pt.

## 2022-04-03 NOTE — Telephone Encounter (Signed)
Patient has persistent diarrhea despite giving time from E. coli/Shiga toxin infection.  Has seen GI and still not improving

## 2022-07-04 ENCOUNTER — Ambulatory Visit (INDEPENDENT_AMBULATORY_CARE_PROVIDER_SITE_OTHER): Payer: Medicare Other | Admitting: Family Medicine

## 2022-07-04 ENCOUNTER — Encounter: Payer: Self-pay | Admitting: Family Medicine

## 2022-07-04 VITALS — BP 119/70 | HR 88 | Temp 97.4°F | Ht 64.0 in | Wt 140.6 lb

## 2022-07-04 DIAGNOSIS — Z13220 Encounter for screening for lipoid disorders: Secondary | ICD-10-CM | POA: Diagnosis not present

## 2022-07-04 DIAGNOSIS — R0789 Other chest pain: Secondary | ICD-10-CM

## 2022-07-04 DIAGNOSIS — R002 Palpitations: Secondary | ICD-10-CM

## 2022-07-04 NOTE — Progress Notes (Signed)
BP 119/70   Pulse 88   Temp (!) 97.4 F (36.3 C)   Ht _0  (1.626 m)   Wt 140 lb 9.6 oz (63.8 kg)   SpO2 100%   BMI 24.13 kg/m    Subjective:   Patient ID: Cristina Soto, female    DOB: 14-Mar-1971, 51 y.o.   MRN: [id number]  HPI: Cristina Soto is a 51 y.o. female presenting on 07/04/2022 for Referral (Pt wants cardiology referral)   HPI Patient comes in today with palpitations and chest tightness she feels her been going on since she had COVID.  She just feels like it is worse when she is laying down and feels like it is affecting her energy especially when she is lying down.  When she does exercise she feels like she gets more energy but she feels like she just has inflammation around her heart and some tightness and she just cannot clear it since she had COVID.  She would like this evaluated with a cardiologist.  She denies any shortness of breath.  She feels like it flutters or shakes or vibrates in her chest sometimes.  Relevant past medical, surgical, family and social history reviewed and updated as indicated. Interim medical history since our last visit reviewed. Allergies and medications reviewed and updated.  Review of Systems  Constitutional:  Negative for chills and fever.  Eyes:  Negative for redness and visual disturbance.  Respiratory:  Positive for chest tightness. Negative for cough, shortness of breath and wheezing.   Cardiovascular:  Positive for palpitations. Negative for chest pain and leg swelling.  Musculoskeletal:  Negative for back pain and gait problem.  Skin:  Negative for rash.  Neurological:  Negative for dizziness, light-headedness and headaches.  Psychiatric/Behavioral:  Negative for agitation and behavioral problems.   All other systems reviewed and are negative.   Per HPI unless specifically indicated above   Allergies as of 07/04/2022       Reactions   Corticosteroids Nausea And Vomiting   Erythromycin Nausea And Vomiting    Promethazine Hcl Nausea And Vomiting   Phenergan        Medication List        Accurate as of July 04, 2022 11:28 AM. If you have any questions, ask your nurse or doctor.          STOP taking these medications    FLUoxetine 10 MG tablet Commonly known as: PROZAC Stopped by: Fransisca Kaufmann Margeret Stachnik, MD   ondansetron 4 MG disintegrating tablet Commonly known as: ZOFRAN-ODT Stopped by: Fransisca Kaufmann Ilian Wessell, MD       TAKE these medications    ADVIL PO Take by mouth as needed.   aspirin-acetaminophen-caffeine 250-250-65 MG tablet Commonly known as: EXCEDRIN MIGRAINE Take 2 tablets by mouth as needed for headache.   Tylenol 325 MG Caps Generic drug: Acetaminophen Take by mouth as needed.         Objective:   BP 119/70   Pulse 88   Temp (!) 97.4 F (36.3 C)   Ht _1  (1.626 m)   Wt 140 lb 9.6 oz (63.8 kg)   SpO2 100%   BMI 24.13 kg/m   Wt Readings from Last 3 Encounters:  07/04/22 140 lb 9.6 oz (63.8 kg)  01/31/22 134 lb 3.2 oz (60.9 kg)  01/23/22 133 lb (60.3 kg)    Physical Exam Vitals and nursing note reviewed.  Constitutional:      General: She is not in acute distress.  Appearance: She is well-developed. She is not diaphoretic.  Eyes:     Conjunctiva/sclera: Conjunctivae normal.  Cardiovascular:     Rate and Rhythm: Normal rate and regular rhythm.     Heart sounds: Normal heart sounds. No murmur heard. Pulmonary:     Effort: Pulmonary effort is normal. No respiratory distress.     Breath sounds: Normal breath sounds. No wheezing, rhonchi or rales.  Musculoskeletal:        General: No swelling or tenderness. Normal range of motion.  Skin:    General: Skin is warm and dry.     Findings: No rash.  Neurological:     Mental Status: She is alert and oriented to person, place, and time.     Coordination: Coordination normal.  Psychiatric:        Behavior: Behavior normal.       Assessment & Plan:   Problem List Items Addressed This  Visit   None Visit Diagnoses     Palpitations    -  Primary   Relevant Orders   CBC with Differential/Platelet   CMP14+EGFR   Lipid panel   Ambulatory referral to Cardiology   TSH   Chest tightness       Relevant Orders   CBC with Differential/Platelet   CMP14+EGFR   Lipid panel   Ambulatory referral to Cardiology   TSH   Lipid screening       Relevant Orders   Lipid panel       We will do some blood work today especially thyroid testing to make sure nothing is going on there, will refer to cardiology for evaluation Follow up plan: Return if symptoms worsen or fail to improve.  Counseling provided for all of the vaccine components Orders Placed This Encounter  Procedures   CBC with Differential/Platelet   CMP14+EGFR   Lipid panel   TSH   Ambulatory referral to Cardiology    Caryl Pina, MD [facility] 07/04/2022, 11:28 AM

## 2022-07-05 LAB — CBC WITH DIFFERENTIAL/PLATELET
Basophils Absolute: 0.1 10*3/uL (ref 0.0–0.2)
Basos: 1 %
EOS (ABSOLUTE): 0.1 10*3/uL (ref 0.0–0.4)
Eos: 2 %
Hematocrit: 41.3 % (ref 34.0–46.6)
Hemoglobin: 14.3 g/dL (ref 11.1–15.9)
Immature Grans (Abs): 0 10*3/uL (ref 0.0–0.1)
Immature Granulocytes: 0 %
Lymphocytes Absolute: 0.6 10*3/uL — ABNORMAL LOW (ref 0.7–3.1)
Lymphs: 13 %
MCH: 30.1 pg (ref 26.6–33.0)
MCHC: 34.6 g/dL (ref 31.5–35.7)
MCV: 87 fL (ref 79–97)
Monocytes Absolute: 0.4 10*3/uL (ref 0.1–0.9)
Monocytes: 10 %
Neutrophils Absolute: 3.4 10*3/uL (ref 1.4–7.0)
Neutrophils: 74 %
Platelets: 237 10*3/uL (ref 150–450)
RBC: 4.75 x10E6/uL (ref 3.77–5.28)
RDW: 12.3 % (ref 11.7–15.4)
WBC: 4.6 10*3/uL (ref 3.4–10.8)

## 2022-07-05 LAB — LIPID PANEL
Chol/HDL Ratio: 3.4 ratio (ref 0.0–4.4)
Cholesterol, Total: 161 mg/dL (ref 100–199)
HDL: 47 mg/dL (ref >39)
LDL Chol Calc (NIH): 105 mg/dL — ABNORMAL HIGH (ref 0–99)
Triglycerides: 42 mg/dL (ref 0–149)
VLDL Cholesterol Cal: 9 mg/dL (ref 5–40)

## 2022-07-05 LAB — CMP14+EGFR
ALT: 12 IU/L (ref 0–32)
AST: 17 IU/L (ref 0–40)
Albumin/Globulin Ratio: 1.7 (ref 1.2–2.2)
Albumin: 4.4 g/dL (ref 3.8–4.9)
Alkaline Phosphatase: 87 IU/L (ref 44–121)
BUN/Creatinine Ratio: 12 (ref 9–23)
BUN: 9 mg/dL (ref 6–24)
Bilirubin Total: 0.5 mg/dL (ref 0.0–1.2)
CO2: 25 mmol/L (ref 20–29)
Calcium: 10 mg/dL (ref 8.7–10.2)
Chloride: 103 mmol/L (ref 96–106)
Creatinine, Ser: 0.75 mg/dL (ref 0.57–1.00)
Globulin, Total: 2.6 g/dL (ref 1.5–4.5)
Glucose: 107 mg/dL — ABNORMAL HIGH (ref 70–99)
Potassium: 4.2 mmol/L (ref 3.5–5.2)
Sodium: 143 mmol/L (ref 134–144)
Total Protein: 7 g/dL (ref 6.0–8.5)
eGFR: 96 mL/min/{1.73_m2} (ref >59)

## 2022-07-05 LAB — TSH: TSH: 2.72 u[IU]/mL (ref 0.450–4.500)

## 2022-07-13 ENCOUNTER — Encounter: Payer: Self-pay | Admitting: Family Medicine

## 2022-08-14 ENCOUNTER — Encounter: Payer: Medicare Other | Attending: Psychology | Admitting: Psychology

## 2022-08-14 DIAGNOSIS — F0630 Mood disorder due to known physiological condition, unspecified: Secondary | ICD-10-CM | POA: Diagnosis not present

## 2022-08-14 DIAGNOSIS — F064 Anxiety disorder due to known physiological condition: Secondary | ICD-10-CM | POA: Insufficient documentation

## 2022-08-14 DIAGNOSIS — G43001 Migraine without aura, not intractable, with status migrainosus: Secondary | ICD-10-CM | POA: Insufficient documentation
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
## 2022-08-23 ENCOUNTER — Ambulatory Visit: Payer: Medicare Other | Admitting: Cardiovascular Disease

## 2022-09-02 ENCOUNTER — Ambulatory Visit (INDEPENDENT_AMBULATORY_CARE_PROVIDER_SITE_OTHER): Payer: Medicare Other | Admitting: Nurse Practitioner

## 2022-09-02 VITALS — BP 104/64 | HR 90

## 2022-09-02 DIAGNOSIS — R4189 Other symptoms and signs involving cognitive functions and awareness: Secondary | ICD-10-CM | POA: Diagnosis not present

## 2022-09-02 DIAGNOSIS — F39 Unspecified mood [affective] disorder: Secondary | ICD-10-CM

## 2022-09-02 MED ORDER — SERTRALINE HCL 25 MG PO TABS: 25.0000 mg | ORAL_TABLET | Freq: Every day | ORAL | 1 refills | Status: DC

## 2022-09-02 NOTE — Progress Notes (Signed)
   Acute Office Visit  Subjective:    Patient ID: Cristina Soto, female    DOB: 1971-05-03, 51 y.o.   MRN: [id number]   HPI 51 y.o. presents today for emotional state. She has been struggling with moods for a few years now, mostly since pandemic. She was also starting to go through perimenopause during that time. She describes her day as waking up in a "freeze" with no energy or ability to be productive. But as soon as she is around others or has a routine planned she does great. She denies sadness just more brain fog and inability to get things done and be organized. Did not tolerate estradiol patch or Prozac (took for 1 day and felt more agitated). She sees therapy but they cannot see her as often as she would like. She knows she needs to find a new job that is face to face as she does not do well being at home, but she has not actively looked. Boyfriend moved to [place] recently and he helps her moods in a positive way so she is struggling. She is unsure if she wants to try any medications.    Review of Systems  Constitutional:  Positive for fatigue.  Psychiatric/Behavioral:  Positive for agitation, decreased concentration and dysphoric mood. Negative for self-injury and suicidal ideas. The patient is nervous/anxious.        Objective:    Physical Exam Constitutional:      Appearance: Normal appearance.  Psychiatric:        Attention and Perception: She is inattentive.        Mood and Affect: Mood is anxious.        Speech: Speech normal.        Behavior: Behavior normal. Behavior is cooperative.        Thought Content: Thought content normal.     BP 104/64   Pulse 90   LMP 05/22/2021   SpO2 96%  Wt Readings from Last 3 Encounters:  07/04/22 140 lb 9.6 oz (63.8 kg)  01/31/22 134 lb 3.2 oz (60.9 kg)  01/23/22 133 lb (60.3 kg)         Assessment & Plan:   Problem List Items Addressed This Visit   None Visit Diagnoses     Brain fog    -  Primary   Mood disorder  (HCC)       Relevant Medications   sertraline (ZOLOFT) 25 MG tablet      Plan: Discussed management with continuing therapy, making life changes that best suit her overall wellbeing, and medication management. Did not tolerate HRT. Recommend neuro mag nightly for help with brain fog. Zoloft 25 mg daily - she is very nervous about starting normal low dose, so she may start with half tablet if she prefers. We discussed possible initial side effects and reassured that these typically subside after a couple of weeks.      Tamela Gammon DNP, 3:48 PM 09/02/2022

## 2022-09-16 ENCOUNTER — Ambulatory Visit: Payer: Medicare Other | Admitting: Family Medicine

## 2022-09-16 ENCOUNTER — Encounter: Payer: Self-pay | Admitting: Family Medicine

## 2022-09-16 ENCOUNTER — Telehealth: Payer: Self-pay | Admitting: Psychology

## 2022-09-16 VITALS — BP 115/67 | HR 96 | Temp 98.0°F | Wt 134.0 lb

## 2022-09-16 DIAGNOSIS — F419 Anxiety disorder, unspecified: Secondary | ICD-10-CM | POA: Diagnosis not present

## 2022-09-16 DIAGNOSIS — F4320 Adjustment disorder, unspecified: Secondary | ICD-10-CM

## 2022-09-16 DIAGNOSIS — F32A Depression, unspecified: Secondary | ICD-10-CM

## 2022-09-16 MED ORDER — BUPROPION HCL ER (XL) 150 MG PO TB24: 150.0000 mg | ORAL_TABLET | Freq: Every day | ORAL | 1 refills | Status: DC

## 2022-09-16 NOTE — Progress Notes (Signed)
BP 115/67   Pulse 96   Temp 98 F (36.7 C)   Wt 134 lb (60.8 kg)   LMP 05/22/2021   SpO2 100%   BMI 23.00 kg/m    Subjective:   Patient ID: Cristina Soto, female    DOB: 1970/12/20, 51 y.o.   MRN: [id number]  HPI: Cristina Soto is a 51 y.o. female presenting on 09/16/2022 for Medical Management of Chronic Issues and Brain fog (Per pt)   HPI Patient is coming in to discuss mood and emotional issues that she has been having.  She says she had a lot of stress recently over the past year since COVID and working from home and she is struggling with focus and feeling overwhelmed and less organized.  She is feeling the thoughts of just wanting to get away or run away.  Her boyfriend moved to [place] and that is not helped the situation has become worse.  She did try Prozac a year ago and then was given Zoloft but never took it.  She is taking magnesium but does not seem to be helping.  She denies any suicidal ideations or thoughts of hurting self.  She says she just frequently feels overwhelmed and lack of focus and has a brain fog and feels stressed often.  Relevant past medical, surgical, family and social history reviewed and updated as indicated. Interim medical history since our last visit reviewed. Allergies and medications reviewed and updated.  Review of Systems  Constitutional:  Negative for chills and fever.  Eyes:  Negative for visual disturbance.  Respiratory:  Negative for chest tightness and shortness of breath.   Cardiovascular:  Negative for chest pain and leg swelling.  Skin:  Negative for rash.  Neurological:  Negative for light-headedness and headaches.  Psychiatric/Behavioral:  Positive for dysphoric mood. Negative for agitation, behavioral problems, self-injury, sleep disturbance and suicidal ideas. The patient is nervous/anxious.   All other systems reviewed and are negative.   Per HPI unless specifically indicated above   Allergies as of 09/16/2022        Reactions   Corticosteroids Nausea And Vomiting   Erythromycin Nausea And Vomiting   Promethazine Hcl Nausea And Vomiting   Phenergan   Prozac [fluoxetine] Other (See Comments)   Increased agitation/frustration        Medication List        Accurate as of September 16, 2022  4:11 PM. If you have any questions, ask your nurse or doctor.          STOP taking these medications    sertraline 25 MG tablet Commonly known as: Zoloft Stopped by: Nils Pyle, MD       TAKE these medications    ADVIL PO Take by mouth as needed.   aspirin-acetaminophen-caffeine 250-250-65 MG tablet Commonly known as: EXCEDRIN MIGRAINE Take 2 tablets by mouth as needed for headache.   buPROPion 150 MG 24 hr tablet Commonly known as: Wellbutrin XL Take 1 tablet (150 mg total) by mouth daily. Started by: Elige Radon Markez Dowland, MD   magnesium 30 MG tablet Take 30 mg by mouth 2 (two) times daily.   Tylenol 325 MG Caps Generic drug: Acetaminophen Take by mouth as needed.         Objective:   BP 115/67   Pulse 96   Temp 98 F (36.7 C)   Wt 134 lb (60.8 kg)   LMP 05/22/2021   SpO2 100%   BMI 23.00 kg/m   Wt  Readings from Last 3 Encounters:  09/16/22 134 lb (60.8 kg)  07/04/22 140 lb 9.6 oz (63.8 kg)  01/31/22 134 lb 3.2 oz (60.9 kg)    Physical Exam Vitals and nursing note reviewed.  Constitutional:      General: She is not in acute distress.    Appearance: She is well-developed. She is not diaphoretic.  Eyes:     Conjunctiva/sclera: Conjunctivae normal.  Musculoskeletal:        General: No tenderness. Normal range of motion.  Skin:    General: Skin is warm and dry.     Findings: No rash.  Neurological:     Mental Status: She is alert and oriented to person, place, and time.     Coordination: Coordination normal.  Psychiatric:        Behavior: Behavior normal.       Assessment & Plan:   Problem List Items Addressed This Visit   None Visit Diagnoses      Anxiety and depression    -  Primary   Relevant Medications   buPROPion (WELLBUTRIN XL) 150 MG 24 hr tablet   Adult situational stress disorder       Relevant Medications   buPROPion (WELLBUTRIN XL) 150 MG 24 hr tablet       Patient did not want to do Zoloft, did not try it, she would like to try Wellbutrin.  We will see if that can help with focus and stress and anxiety. Follow up plan: Return in about 4 weeks (around 10/14/2022), or if symptoms worsen or fail to improve, for Anxiety recheck in 4 weeks.  Counseling provided for all of the vaccine components No orders of the defined types were placed in this encounter.   Caryl Pina, MD [facility] 09/16/2022, 4:11 PM

## 2022-09-16 NOTE — Telephone Encounter (Signed)
Patient would like for you to give her a call.

## 2022-09-18 ENCOUNTER — Telehealth: Payer: Self-pay | Admitting: Family Medicine

## 2022-09-18 NOTE — Telephone Encounter (Signed)
Patient wants to talk to Dr Dettinger or his nurse about a medication. No other information given. Please call back.

## 2022-09-18 NOTE — Telephone Encounter (Signed)
Lmtcb.

## 2022-09-18 NOTE — Telephone Encounter (Signed)
The sleepiness is not a usual side effect of this because it has norepinephrine so that is usually opposite but some of the warmth and twitches could be, I would still try and give it more time, do not stop it just yet.,   think the symptoms will all go away

## 2022-09-18 NOTE — Telephone Encounter (Signed)
Patient wanted to make you aware of some side effects she thinks may be coming from the Wellbutrin after taking one dose.  She took it in the AM and about 3-4 hours later experienced sleepiness, at bedtime she was having hot flashes, left arm twitching, feelings like a  vibration under her skin, heart fluttering feeling, and felt like she was having chills.

## 2022-09-19 ENCOUNTER — Encounter: Payer: Self-pay | Admitting: Cardiovascular Disease

## 2022-09-19 NOTE — Progress Notes (Signed)
This encounter was created in error - please disregard.

## 2022-09-20 ENCOUNTER — Ambulatory Visit: Payer: Medicare Other | Admitting: Cardiovascular Disease

## 2022-09-24 NOTE — Telephone Encounter (Signed)
Pt informed of Dr. Darrol Poke recommendations. States that she only took one dose. She will resume and call back if needed.

## 2022-10-02 ENCOUNTER — Encounter: Payer: Self-pay | Admitting: Family Medicine

## 2022-10-02 ENCOUNTER — Ambulatory Visit (INDEPENDENT_AMBULATORY_CARE_PROVIDER_SITE_OTHER): Payer: Medicare Other | Admitting: Family Medicine

## 2022-10-02 VITALS — BP 121/67 | HR 96 | Temp 98.2°F | Ht 64.0 in | Wt 140.0 lb

## 2022-10-02 DIAGNOSIS — F419 Anxiety disorder, unspecified: Secondary | ICD-10-CM | POA: Diagnosis not present

## 2022-10-02 DIAGNOSIS — R040 Epistaxis: Secondary | ICD-10-CM | POA: Diagnosis not present

## 2022-10-02 NOTE — Progress Notes (Signed)
BP 121/67   Pulse 96   Temp 98.2 F (36.8 C)   Ht 5\' 4"  (1.626 m)   Wt 140 lb (63.5 kg)   LMP 05/22/2021   SpO2 100%   BMI 24.03 kg/m    Subjective:   Patient ID: 07/23/2021, female    DOB: 09/10/1971, 51 y.o.   MRN: [id number]  HPI: Cristina Soto is a 51 y.o. female presenting on 10/02/2022 for Medical Management of Chronic Issues, Anxiety, and Depression (Wellbutrin caused muscle twitching. Took one dose. Thinking about taking again, but take in pm instead of am)   HPI Anxiety Patient is coming in for anxiety recheck.  She was given Wellbutrin on her last visit.  She tried the Wellbutrin 1 time and had a little bit of jitters and shakes, she does want to try it again she is going to try it in the evening but has not tried it yet.  She does still feel anxious and she does want to try the Wellbutrin again.    01/23/2022    2:35 PM 10/15/2021    2:50 PM 09/19/2021   10:09 AM 03/17/2020    9:40 AM 03/17/2019    4:17 PM  Depression screen PHQ 2/9  Decreased Interest 0 0 0 0 0  Down, Depressed, Hopeless 0 0 0 0 0  PHQ - 2 Score 0 0 0 0 0  Altered sleeping 0 0 2    Tired, decreased energy 1 0 3    Change in appetite 0 0 1    Feeling bad or failure about yourself  0 0 1    Trouble concentrating 1 0 2    Moving slowly or fidgety/restless 0 0 0    Suicidal thoughts 0 0 0    PHQ-9 Score 2 0 9    Difficult doing work/chores  Not difficult at all Somewhat difficult      Patient has been having some nasal bleeding and sometimes clots coming from her nose that has been happening over the past week or 2 since she has been back in the heat her apartment.  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  Relevant past medical, surgical, family and social history reviewed and  updated as indicated. Interim medical history since our last visit reviewed. Allergies and medications reviewed and updated.  Review of Systems  Constitutional:  Negative for chills and fever.  HENT:  Positive for congestion, nosebleeds, postnasal drip, rhinorrhea and sinus pressure. Negative for ear discharge, ear pain, sinus pain, sneezing and sore throat.   Eyes:  Negative for redness and visual disturbance.  Respiratory:  Negative for chest tightness and shortness of breath.   Cardiovascular:  Negative for chest pain and leg swelling.  Genitourinary:  Negative for difficulty urinating and dysuria.  Musculoskeletal:  Negative for back pain and gait problem.  Skin:  Negative for rash.  Neurological:  Negative for light-headedness and headaches.  Psychiatric/Behavioral:  Negative for agitation and behavioral problems.   All other systems reviewed and are negative.   Per HPI unless specifically indicated above   Allergies as of 10/02/2022       Reactions   Corticosteroids Nausea And Vomiting   Erythromycin Nausea And Vomiting  Promethazine Hcl Nausea And Vomiting   Phenergan   Prozac [fluoxetine] Other (See Comments)   Increased agitation/frustration        Medication List        Accurate as of October 02, 2022  4:32 PM. If you have any questions, ask your nurse or doctor.          ADVIL PO Take by mouth as needed.   aspirin-acetaminophen-caffeine 250-250-65 MG tablet Commonly known as: EXCEDRIN MIGRAINE Take 2 tablets by mouth as needed for headache.   buPROPion 150 MG 24 hr tablet Commonly known as: Wellbutrin XL Take 1 tablet (150 mg total) by mouth daily.   magnesium 30 MG tablet Take 30 mg by mouth 2 (two) times daily.   Tylenol 325 MG Caps Generic drug: Acetaminophen Take by mouth as needed.         Objective:   BP 121/67   Pulse 96   Temp 98.2 F (36.8 C)   Ht 5\' 4"  (1.626 m)   Wt 140 lb (63.5 kg)   LMP 05/22/2021   SpO2 100%   BMI  24.03 kg/m   Wt Readings from Last 3 Encounters:  10/02/22 140 lb (63.5 kg)  09/16/22 134 lb (60.8 kg)  07/04/22 140 lb 9.6 oz (63.8 kg)    Physical Exam Vitals and nursing note reviewed.  Constitutional:      General: She is not in acute distress.    Appearance: She is well-developed. She is not diaphoretic.  HENT:     Right Ear: Tympanic membrane and ear canal normal.     Left Ear: Tympanic membrane and ear canal normal.     Nose: Nose normal. No congestion or rhinorrhea.     Mouth/Throat:     Mouth: Mucous membranes are moist.     Pharynx: No oropharyngeal exudate or posterior oropharyngeal erythema.  Eyes:     Conjunctiva/sclera: Conjunctivae normal.  Cardiovascular:     Rate and Rhythm: Normal rate and regular rhythm.     Heart sounds: Normal heart sounds. No murmur heard. Pulmonary:     Effort: Pulmonary effort is normal. No respiratory distress.     Breath sounds: Normal breath sounds. No wheezing.  Musculoskeletal:        General: No swelling or tenderness. Normal range of motion.  Skin:    General: Skin is warm and dry.     Findings: No rash.  Neurological:     Mental Status: She is alert and oriented to person, place, and time.     Coordination: Coordination normal.  Psychiatric:        Behavior: Behavior normal.       Assessment & Plan:   Problem List Items Addressed This Visit       Other   Anxiety - Primary   Other Visit Diagnoses     Nasal bleeding       Relevant Orders   CBC with Differential/Platelet        Follow up plan: Return in about 4 weeks (around 10/30/2022), or if symptoms worsen or fail to improve, for anxiety recheck.  Counseling provided for all of the vaccine components Orders Placed This Encounter  Procedures   CBC with Differential/Platelet    11/01/2022, MD [facility] 10/02/2022, 4:32 PM

## 2022-10-03 LAB — CBC WITH DIFFERENTIAL/PLATELET
Basophils Absolute: 0.1 10*3/uL (ref 0.0–0.2)
Basos: 1 %
EOS (ABSOLUTE): 0.1 10*3/uL (ref 0.0–0.4)
Eos: 2 %
Hematocrit: 41.2 % (ref 34.0–46.6)
Hemoglobin: 13.9 g/dL (ref 11.1–15.9)
Immature Grans (Abs): 0 10*3/uL (ref 0.0–0.1)
Immature Granulocytes: 0 %
Lymphocytes Absolute: 0.9 10*3/uL (ref 0.7–3.1)
Lymphs: 15 %
MCH: 30 pg (ref 26.6–33.0)
MCHC: 33.7 g/dL (ref 31.5–35.7)
MCV: 89 fL (ref 79–97)
Monocytes Absolute: 0.7 10*3/uL (ref 0.1–0.9)
Monocytes: 12 %
Neutrophils Absolute: 4.1 10*3/uL (ref 1.4–7.0)
Neutrophils: 70 %
Platelets: 281 10*3/uL (ref 150–450)
RBC: 4.63 x10E6/uL (ref 3.77–5.28)
RDW: 12 % (ref 11.7–15.4)
WBC: 5.9 10*3/uL (ref 3.4–10.8)

## 2022-10-07 ENCOUNTER — Encounter: Payer: Medicare Other | Attending: Psychology | Admitting: Psychology

## 2022-10-07 DIAGNOSIS — F064 Anxiety disorder due to known physiological condition: Secondary | ICD-10-CM | POA: Diagnosis not present

## 2022-10-07 DIAGNOSIS — F0630 Mood disorder due to known physiological condition, unspecified: Secondary | ICD-10-CM | POA: Insufficient documentation

## 2022-10-07 DIAGNOSIS — G43001 Migraine without aura, not intractable, with status migrainosus: Secondary | ICD-10-CM | POA: Diagnosis not present

## 2022-10-08 ENCOUNTER — Other Ambulatory Visit: Payer: Self-pay | Admitting: Family Medicine

## 2022-10-08 ENCOUNTER — Encounter: Payer: Self-pay | Admitting: Psychology

## 2022-10-08 DIAGNOSIS — F4320 Adjustment disorder, unspecified: Secondary | ICD-10-CM

## 2022-10-08 DIAGNOSIS — F32A Depression, unspecified: Secondary | ICD-10-CM
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
## 2022-10-13 NOTE — Progress Notes (Signed)
Cardiology Office Note:    Date:  10/14/2022   ID:  Cristina Soto, DOB [birth year], MRN [id number]  PCP:  Dettinger, Elige Radon, MD  Cardiologist:  None   Referring MD: Dettinger, Elige Radon, MD   Chief Complaint  Patient presents with   Irregular Heart Beat   Chest Pain    History of Present Illness:    Cristina Soto is a 51 y.o. female with a hx of anxiety, depression, seen for chest pain and palpitation evaluation.   Vague complaints about how she felt greater than a year ago, with some of the symptoms being during the pandemic.  States she had brain fog.  Difficult time trying to get out of bed each day.  No energy.  Quivering and racing heart.  She did have COVID-19 but cannot remember exactly when in the course of these conversations.  [name] was a very active day.  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  They had to set up for singing at [employer].  She is one of the singers and had no difficulty.  She feels great today.  She states that this appointment was set up more than a year ago.  She was having trouble with them but none now.  Her symptoms have not consisted of chest pain, orthopnea, or PND.  She denies history of heart murmur.  Her father sees Dr. Elease Hashimoto but she does not know what is underlying cardiac condition is.  Past Medical History:  Diagnosis Date   Anxiety    Asthma    Chronic fatigue fibromyalgia syndrome    CIN I (cervical intraepithelial neoplasia I) 2002   Depression    Fibromyalgia    Hemorrhoids    Migraine    Vulvar pruritus     Past Surgical History:  Procedure Laterality Date   COLPOSCOPY  2002   DILATION AND CURETTAGE OF UTERUS  1997   TH.AB    Current Medications: Current Meds  Medication Sig   Acetaminophen (TYLENOL) 325 MG CAPS Take by mouth as needed.   alclomethasone (ACLOVATE) 0.05 % cream Apply 1 Application topically 2 (two) times daily.   aspirin-acetaminophen-caffeine (EXCEDRIN MIGRAINE) 250-250-65 MG tablet Take 2  tablets by mouth as needed for headache.   Ibuprofen (ADVIL PO) Take by mouth as needed.   Ivermectin 1 % CREA Apply topically 2 (two) times daily.   magnesium 30 MG tablet Take 30 mg by mouth 2 (two) times daily.   mupirocin ointment (BACTROBAN) 2 % Apply 1 Application topically 2 (two) times daily.     Allergies:   Corticosteroids, Erythromycin, Promethazine hcl, and Prozac [fluoxetine]   Social History   Socioeconomic History   Marital status: Single    Spouse name: Not on file   Number of children: 0   Years of education: Not on file   Highest education level: Bachelor's degree (e.g., BA, AB, BS)  Occupational History   Occupation: Sales   Occupation: Musician  Tobacco Use   Smoking status: Never   Smokeless tobacco: Never  Vaping Use   Vaping Use: Never used  Substance and Sexual Activity   Alcohol use: Not Currently    Alcohol/week: 0.0 standard drinks of alcohol   Drug use: No   Sexual activity: Not Currently    Birth control/protection: Abstinence    Comment: 1st intercourse 51 yo-More than 5 partners  Other Topics Concern   Not on file  Social History Narrative   Works part time as a Clinical research associate.  Social Determinants of Health   Financial Resource Strain: Low Risk  (10/15/2021)   Overall Financial Resource Strain (CARDIA)    Difficulty of Paying Living Expenses: Not very hard  Food Insecurity: Food Insecurity Present (10/15/2021)   Hunger Vital Sign    Worried About Running Out of Food in the Last Year: Sometimes true    Ran Out of Food in the Last Year: Sometimes true  Transportation Needs: No Transportation Needs (10/15/2021)   PRAPARE - Administrator, Civil Service (Medical): No    Lack of Transportation (Non-Medical): No  Physical Activity: Insufficiently Active (10/15/2021)   Exercise Vital Sign    Days of Exercise per Week: 2 days    Minutes of Exercise per Session: 20 min  Stress: No Stress Concern Present (10/15/2021)   [employer] of  Occupational Health - Occupational Stress Questionnaire    Feeling of Stress : Only a little  Social Connections: Moderately Integrated (10/15/2021)   Social Connection and Isolation Panel [NHANES]    Frequency of Communication with Friends and Family: More than three times a week    Frequency of Social Gatherings with Friends and Family: More than three times a week    Attends Religious Services: More than 4 times per year    Active Member of [employer] or Organizations: Yes    Attends Engineer, structural: More than 4 times per year    Marital Status: Never married     Family History: The patient's family history includes Alcohol abuse in her sister; Asthma in her mother; Cancer in her paternal grandfather and paternal grandmother; Diverticulitis in her father and sister. There is no history of Colon cancer, Breast cancer, Colon polyps, Stomach cancer, Esophageal cancer, or Pancreatic cancer.  ROS:   Please see the history of present illness.    None all other systems reviewed and are negative.  EKGs/Labs/Other Studies Reviewed:    The following studies were reviewed today: None  EKG:  EKG normal sinus rhythm, nonspecific/nonsignificant inferior Q waves and inferior and anterolateral leads.  No prior tracing to compare.  Overall normal in appearance.  Recent Labs: 07/04/2022: ALT 12; BUN 9; Creatinine, Ser 0.75; Potassium 4.2; Sodium 143; TSH 2.720 10/02/2022: Hemoglobin 13.9; Platelets 281  Recent Lipid Panel    Component Value Date/Time   CHOL 161 07/04/2022 1142   TRIG 42 07/04/2022 1142   HDL 47 07/04/2022 1142   CHOLHDL 3.4 07/04/2022 1142   CHOLHDL 3.9 02/08/2020 1021   VLDL 9 06/27/2015 1538   LDLCALC 105 (H) 07/04/2022 1142   LDLCALC 95 02/08/2020 1021    Physical Exam:    VS:  BP 104/72   Pulse 83   Ht 5\' 4"  (1.626 m)   Wt 140 lb (63.5 kg)   LMP 05/22/2021   SpO2 99%   BMI 24.03 kg/m     Wt Readings from Last 3 Encounters:  10/14/22 140 lb (63.5 kg)   10/02/22 140 lb (63.5 kg)  09/16/22 134 lb (60.8 kg)     GEN: Healthy. No acute distress HEENT: Normal NECK: No JVD. LYMPHATICS: No lymphadenopathy CARDIAC: No murmur. RRR no gallop, or edema. VASCULAR:  Normal Pulses. No bruits. RESPIRATORY:  Clear to auscultation without rales, wheezing or rhonchi  ABDOMEN: Soft, non-tender, non-distended, No pulsatile mass, MUSCULOSKELETAL: No deformity  SKIN: Warm and dry NEUROLOGIC:  Alert and oriented x 3 PSYCHIATRIC:  Normal affect   ASSESSMENT:    1. Palpitations   2. Chest tightness  3. Migraine without aura and with status migrainosus, not intractable    PLAN:    In order of problems listed above:  Not currently a problem for the last 6 to 12 months. Occurs only when she is isolated as in the pandemic or in a strange place alone.   Did not discuss.  Go with watchful waiting and do no cardiac evaluation at this time.   Medication Adjustments/Labs and Tests Ordered: Current medicines are reviewed at length with the patient today.  Concerns regarding medicines are outlined above.  No orders of the defined types were placed in this encounter.  No orders of the defined types were placed in this encounter.   There are no Patient Instructions on file for this visit.   Signed, Lesleigh Noe, MD  10/14/2022 3:11 PM     [facility]

## 2022-10-14 ENCOUNTER — Ambulatory Visit: Payer: Medicare Other | Admitting: Family Medicine

## 2022-10-14 ENCOUNTER — Encounter: Payer: Self-pay | Admitting: Interventional Cardiology

## 2022-10-14 ENCOUNTER — Ambulatory Visit: Payer: Medicare Other | Attending: Cardiovascular Disease | Admitting: Interventional Cardiology

## 2022-10-14 VITALS — BP 104/72 | HR 83 | Ht 64.0 in | Wt 140.0 lb

## 2022-10-14 DIAGNOSIS — G43001 Migraine without aura, not intractable, with status migrainosus: Secondary | ICD-10-CM

## 2022-10-14 DIAGNOSIS — R002 Palpitations: Secondary | ICD-10-CM

## 2022-10-14 DIAGNOSIS — R0789 Other chest pain: Secondary | ICD-10-CM | POA: Diagnosis not present

## 2022-10-14 NOTE — Patient Instructions (Signed)
Medication Instructions:  Your physician recommends that you continue on your current medications as directed. Please refer to the Current Medication list given to you today.  *If you need a refill on your cardiac medications before your next appointment, please call your pharmacy*  Follow-Up: As needed  Important Information About Sugar       

## 2022-10-16 ENCOUNTER — Ambulatory Visit (INDEPENDENT_AMBULATORY_CARE_PROVIDER_SITE_OTHER): Payer: Medicare Other

## 2022-10-16 VITALS — Ht 64.0 in | Wt 138.0 lb

## 2022-10-16 DIAGNOSIS — Z1211 Encounter for screening for malignant neoplasm of colon: Secondary | ICD-10-CM

## 2022-10-16 DIAGNOSIS — Z0000 Encounter for general adult medical examination without abnormal findings: Secondary | ICD-10-CM | POA: Diagnosis not present

## 2022-10-16 DIAGNOSIS — Z1231 Encounter for screening mammogram for malignant neoplasm of breast: Secondary | ICD-10-CM

## 2022-10-16 NOTE — Patient Instructions (Signed)
Cristina Soto , Thank you for taking time to come for your Medicare Wellness Visit. I appreciate your ongoing commitment to your health goals. Please review the following plan we discussed and let me know if I can assist you in the future.   These are the goals we discussed:  Goals      Exercise 3x per week (30 min per time)     Increase cardiovascular exercise such as walking or stationary bike to 3-4 times per week for 30 minutes each session.       Have 3 meals a day     Eat healthier and improve health. Would like to return back to school.         This is a list of the screening recommended for you and due dates:  Health Maintenance  Topic Date Due   DTaP/Tdap/Td vaccine (1 - Tdap) Never done   Zoster (Shingles) Vaccine (1 of 2) Never done   COVID-19 Vaccine (2 - 2023-24 season) 07/12/2022   Mammogram  10/15/2022   Flu Shot  02/09/2023*   Colon Cancer Screening  07/05/2023*   Hepatitis C Screening: USPSTF Recommendation to screen - Ages 18-79 yo.  07/05/2023*   HIV Screening  07/05/2023*   Pap Smear  01/14/2023   Medicare Annual Wellness Visit  10/17/2023   HPV Vaccine  Aged Out  *Topic was postponed. The date shown is not the original due date.    Advanced directives: Advance directive discussed with you today. I have provided a copy for you to complete at home and have notarized. Once this is complete please bring a copy in to our office so we can scan it into your chart.   Conditions/risks identified: Aim for 30 minutes of exercise or brisk walking, 6-8 glasses of water, and 5 servings of fruits and vegetables each day.   Next appointment: Follow up in one year for your annual wellness visit    Preventive Care 65 Years and Older, Female Preventive care refers to lifestyle choices and visits with your health care provider that can promote health and wellness. What does preventive care include? A yearly physical exam. This is also called an annual well check. Dental  exams once or twice a year. Routine eye exams. Ask your health care provider how often you should have your eyes checked. Personal lifestyle choices, including: Daily care of your teeth and gums. Regular physical activity. Eating a healthy diet. Avoiding tobacco and drug use. Limiting alcohol use. Practicing safe sex. Taking low-dose aspirin every day. Taking vitamin and mineral supplements as recommended by your health care provider. What happens during an annual well check? The services and screenings done by your health care provider during your annual well check will depend on your age, overall health, lifestyle risk factors, and family history of disease. Counseling  Your health care provider may ask you questions about your: Alcohol use. Tobacco use. Drug use. Emotional well-being. Home and relationship well-being. Sexual activity. Eating habits. History of falls. Memory and ability to understand (cognition). Work and work Astronomer. Reproductive health. Screening  You may have the following tests or measurements: Height, weight, and BMI. Blood pressure. Lipid and cholesterol levels. These may be checked every 5 years, or more frequently if you are over 65 years old. Skin check. Lung cancer screening. You may have this screening every year starting at age 26 if you have a 30-pack-year history of smoking and currently smoke or have quit within the past 15 years. Fecal occult  blood test (FOBT) of the stool. You may have this test every year starting at age 31. Flexible sigmoidoscopy or colonoscopy. You may have a sigmoidoscopy every 5 years or a colonoscopy every 10 years starting at age 87. Hepatitis C blood test. Hepatitis B blood test. Sexually transmitted disease (STD) testing. Diabetes screening. This is done by checking your blood sugar (glucose) after you have not eaten for a while (fasting). You may have this done every 1-3 years. Bone density scan. This is done  to screen for osteoporosis. You may have this done starting at age 62. Mammogram. This may be done every 1-2 years. Talk to your health care provider about how often you should have regular mammograms. Talk with your health care provider about your test results, treatment options, and if necessary, the need for more tests. Vaccines  Your health care provider may recommend certain vaccines, such as: Influenza vaccine. This is recommended every year. Tetanus, diphtheria, and acellular pertussis (Tdap, Td) vaccine. You may need a Td booster every 10 years. Zoster vaccine. You may need this after age 20. Pneumococcal 13-valent conjugate (PCV13) vaccine. One dose is recommended after age 41. Pneumococcal polysaccharide (PPSV23) vaccine. One dose is recommended after age 63. Talk to your health care provider about which screenings and vaccines you need and how often you need them. This information is not intended to replace advice given to you by your health care provider. Make sure you discuss any questions you have with your health care provider. Document Released: 11/24/2015 Document Revised: 07/17/2016 Document Reviewed: 08/29/2015 Elsevier Interactive Patient Education  2017 [employer].  Fall Prevention in the Home Falls can cause injuries. They can happen to people of all ages. There are many things you can do to make your home safe and to help prevent falls. What can I do on the outside of my home? Regularly fix the edges of walkways and driveways and fix any cracks. Remove anything that might make you trip as you walk through a door, such as a raised step or threshold. Trim any bushes or trees on the path to your home. Use bright outdoor lighting. Clear any walking paths of anything that might make someone trip, such as rocks or tools. Regularly check to see if handrails are loose or broken. Make sure that both sides of any steps have handrails. Any raised decks and porches should have  guardrails on the edges. Have any leaves, snow, or ice cleared regularly. Use sand or salt on walking paths during winter. Clean up any spills in your garage right away. This includes oil or grease spills. What can I do in the bathroom? Use night lights. Install grab bars by the toilet and in the tub and shower. Do not use towel bars as grab bars. Use non-skid mats or decals in the tub or shower. If you need to sit down in the shower, use a plastic, non-slip stool. Keep the floor dry. Clean up any water that spills on the floor as soon as it happens. Remove soap buildup in the tub or shower regularly. Attach bath mats securely with double-sided non-slip rug tape. Do not have throw rugs and other things on the floor that can make you trip. What can I do in the bedroom? Use night lights. Make sure that you have a light by your bed that is easy to reach. Do not use any sheets or blankets that are too big for your bed. They should not hang down onto the floor.  Have a firm chair that has side arms. You can use this for support while you get dressed. Do not have throw rugs and other things on the floor that can make you trip. What can I do in the kitchen? Clean up any spills right away. Avoid walking on wet floors. Keep items that you use a lot in easy-to-reach places. If you need to reach something above you, use a strong step stool that has a grab bar. Keep electrical cords out of the way. Do not use floor polish or wax that makes floors slippery. If you must use wax, use non-skid floor wax. Do not have throw rugs and other things on the floor that can make you trip. What can I do with my stairs? Do not leave any items on the stairs. Make sure that there are handrails on both sides of the stairs and use them. Fix handrails that are broken or loose. Make sure that handrails are as long as the stairways. Check any carpeting to make sure that it is firmly attached to the stairs. Fix any carpet  that is loose or worn. Avoid having throw rugs at the top or bottom of the stairs. If you do have throw rugs, attach them to the floor with carpet tape. Make sure that you have a light switch at the top of the stairs and the bottom of the stairs. If you do not have them, ask someone to add them for you. What else can I do to help prevent falls? Wear shoes that: Do not have high heels. Have rubber bottoms. Are comfortable and fit you well. Are closed at the toe. Do not wear sandals. If you use a stepladder: Make sure that it is fully opened. Do not climb a closed stepladder. Make sure that both sides of the stepladder are locked into place. Ask someone to hold it for you, if possible. Clearly mark and make sure that you can see: Any grab bars or handrails. First and last steps. Where the edge of each step is. Use tools that help you move around (mobility aids) if they are needed. These include: Canes. Walkers. Scooters. Crutches. Turn on the lights when you go into a dark area. Replace any light bulbs as soon as they burn out. Set up your furniture so you have a clear path. Avoid moving your furniture around. If any of your floors are uneven, fix them. If there are any pets around you, be aware of where they are. Review your medicines with your doctor. Some medicines can make you feel dizzy. This can increase your chance of falling. Ask your doctor what other things that you can do to help prevent falls. This information is not intended to replace advice given to you by your health care provider. Make sure you discuss any questions you have with your health care provider. Document Released: 08/24/2009 Document Revised: 04/04/2016 Document Reviewed: 12/02/2014 Elsevier Interactive Patient Education  2017 [employer].

## 2022-10-16 NOTE — Progress Notes (Signed)
Subjective:   Cristina Soto is a 51 y.o. female who presents for Medicare Annual (Subsequent) preventive examination. I connected with  Cristina Soto on 10/16/22 by a audio enabled telemedicine application and verified that I am speaking with the correct person using two identifiers.  Patient Location: Home  Provider Location: Home Office  I discussed the limitations of evaluation and management by telemedicine. The patient expressed understanding and agreed to proceed.  Review of Systems     Cardiac Risk Factors include: advanced age (>62men, >83 women)     Objective:    Today's Vitals   10/16/22 1443  Weight: 138 lb (62.6 kg)  Height: 5\' 4"  (1.626 m)   Body mass index is 23.69 kg/m.     10/16/2022    2:46 PM 01/07/2022    9:07 AM 01/05/2022    8:53 PM 10/28/2021    3:57 PM 10/15/2021    2:58 PM 03/17/2020    9:26 AM 03/17/2019   10:32 AM  Advanced Directives  Does Patient Have a Medical Advance Directive? No No No No No No No  Would patient like information on creating a medical advance directive? No - Patient declined    No - Patient declined No - Patient declined Yes (MAU/Ambulatory/Procedural Areas - Information given)    Current Medications (verified) Outpatient Encounter Medications as of 10/16/2022  Medication Sig   Acetaminophen (TYLENOL) 325 MG CAPS Take by mouth as needed.   alclomethasone (ACLOVATE) 0.05 % cream Apply 1 Application topically 2 (two) times daily.   aspirin-acetaminophen-caffeine (EXCEDRIN MIGRAINE) 250-250-65 MG tablet Take 2 tablets by mouth as needed for headache.   buPROPion (WELLBUTRIN XL) 150 MG 24 hr tablet TAKE 1 TABLET BY MOUTH EVERY DAY   Ibuprofen (ADVIL PO) Take by mouth as needed.   Ivermectin 1 % CREA Apply topically 2 (two) times daily.   magnesium 30 MG tablet Take 30 mg by mouth 2 (two) times daily.   mupirocin ointment (BACTROBAN) 2 % Apply 1 Application topically 2 (two) times daily.   No facility-administered  encounter medications on file as of 10/16/2022.    Allergies (verified) Corticosteroids, Erythromycin, Promethazine hcl, and Prozac [fluoxetine]   History: Past Medical History:  Diagnosis Date   Anxiety    Asthma    Chronic fatigue fibromyalgia syndrome    CIN I (cervical intraepithelial neoplasia I) 2002   Depression    Fibromyalgia    Hemorrhoids    Migraine    Vulvar pruritus    Past Surgical History:  Procedure Laterality Date   COLPOSCOPY  2002   DILATION AND CURETTAGE OF UTERUS  1997   TH.AB   Family History  Problem Relation Age of Onset   Asthma Mother    Diverticulitis Father    Diverticulitis Sister    Alcohol abuse Sister    Cancer Paternal Grandmother        Lung   Cancer Paternal Grandfather        Liver   Colon cancer Neg Hx    Breast cancer Neg Hx    Colon polyps Neg Hx    Stomach cancer Neg Hx    Esophageal cancer Neg Hx    Pancreatic cancer Neg Hx    Social History   Socioeconomic History   Marital status: Single    Spouse name: Not on file   Number of children: 0   Years of education: Not on file   Highest education level: Bachelor's degree (e.g., BA, AB, BS)  Occupational History   Occupation: Airline pilot   Occupation: Musician  Tobacco Use   Smoking status: Never   Smokeless tobacco: Never  Vaping Use   Vaping Use: Never used  Substance and Sexual Activity   Alcohol use: Not Currently    Alcohol/week: 0.0 standard drinks of alcohol   Drug use: No   Sexual activity: Not Currently    Birth control/protection: Abstinence    Comment: 1st intercourse 51 yo-More than 5 partners  Other Topics Concern   Not on file  Social History Narrative   Works part time as a Clinical research associate.    Social Determinants of Health   Financial Resource Strain: Low Risk  (10/16/2022)   Overall Financial Resource Strain (CARDIA)    Difficulty of Paying Living Expenses: Not hard at all  Food Insecurity: No Food Insecurity (10/16/2022)   Hunger Vital Sign    Worried  About Running Out of Food in the Last Year: Never true    Ran Out of Food in the Last Year: Never true  Transportation Needs: No Transportation Needs (10/16/2022)   PRAPARE - Administrator, Civil Service (Medical): No    Lack of Transportation (Non-Medical): No  Physical Activity: Insufficiently Active (10/16/2022)   Exercise Vital Sign    Days of Exercise per Week: 2 days    Minutes of Exercise per Session: 20 min  Stress: No Stress Concern Present (10/16/2022)   [employer] of Occupational Health - Occupational Stress Questionnaire    Feeling of Stress : Not at all  Social Connections: Moderately Integrated (10/16/2022)   Social Connection and Isolation Panel [NHANES]    Frequency of Communication with Friends and Family: More than three times a week    Frequency of Social Gatherings with Friends and Family: More than three times a week    Attends Religious Services: More than 4 times per year    Active Member of [employer] or Organizations: Yes    Attends Banker Meetings: More than 4 times per year    Marital Status: Never married    Tobacco Counseling Counseling given: Not Answered   Clinical Intake:                 Diabetic?no          Activities of Daily Living    10/16/2022    2:46 PM  In your present state of health, do you have any difficulty performing the following activities:  Hearing? 0  Vision? 0  Difficulty concentrating or making decisions? 0  Walking or climbing stairs? 0  Dressing or bathing? 0  Doing errands, shopping? 0  Preparing Food and eating ? N  Using the Toilet? N  In the past six months, have you accidently leaked urine? N  Do you have problems with loss of bowel control? N  Managing your Medications? N  Managing your Finances? N  Housekeeping or managing your Housekeeping? N    Patient Care Team: Dettinger, Elige Radon, MD as PCP - General (Family Medicine)  Indicate any recent Medical Services you  may have received from other than [facility] providers in the past year (date may be approximate).     Assessment:   This is a routine wellness examination for Cristina Soto.  Hearing/Vision screen Vision Screening - Comments:: Wears rx glasses - up to date with routine eye exams with  Dr. Lucretia Roers   Dietary issues and exercise activities discussed: Current Exercise Habits: Home exercise routine, Type of exercise: walking, Time (Minutes):  20, Frequency (Times/Week): 3, Weekly Exercise (Minutes/Week): 60, Intensity: Mild, Exercise limited by: None identified   Goals Addressed             This Visit's Progress    Exercise 3x per week (30 min per time)   On track    Increase cardiovascular exercise such as walking or stationary bike to 3-4 times per week for 30 minutes each session.         Depression Screen    10/16/2022    2:45 PM 01/23/2022    2:35 PM 10/15/2021    2:50 PM 09/19/2021   10:09 AM 03/17/2020    9:40 AM 03/17/2019    4:17 PM 03/17/2019   10:20 AM  PHQ 2/9 Scores  PHQ - 2 Score 0 0 0 0 0 0 0  PHQ- 9 Score  2 0 9       Fall Risk    10/16/2022    2:44 PM 01/23/2022    2:35 PM 10/15/2021    3:02 PM 03/17/2020    9:40 AM 03/17/2019   10:20 AM  Fall Risk   Falls in the past year? 0 0 0 0 0  Number falls in past yr: 0  0    Injury with Fall? 0  0    Risk for fall due to : No Fall Risks  Impaired balance/gait    Follow up Falls prevention discussed  Falls prevention discussed  Education provided;Falls prevention discussed    FALL RISK PREVENTION PERTAINING TO THE HOME:  Any stairs in or around the home? No  If so, are there any without handrails? No  Home free of loose throw rugs in walkways, pet beds, electrical cords, etc? Yes  Adequate lighting in your home to reduce risk of falls? Yes   ASSISTIVE DEVICES UTILIZED TO PREVENT FALLS:  Life alert? No  Use of a cane, walker or w/c? No  Grab bars in the bathroom? No  Shower chair or bench in shower? No  Elevated toilet seat  or a handicapped toilet? No       06/18/2017    2:21 PM  MMSE - Mini Mental State Exam  Orientation to time 5  Orientation to Place 5  Registration 3  Attention/ Calculation 5  Recall 3  Language- name 2 objects 2  Language- repeat 1  Language- follow 3 step command 3  Language- read & follow direction 1  Write a sentence 1  Copy design 1  Total score 30        10/16/2022    2:47 PM 03/17/2020    9:41 AM 03/17/2019   10:29 AM  6CIT Screen  What Year? 0 points 0 points 0 points  What month? 0 points 0 points 0 points  What time? 0 points 0 points 0 points  Count back from 20 0 points 0 points 0 points  Months in reverse 0 points 0 points 0 points  Repeat phrase 0 points 0 points 0 points  Total Score 0 points 0 points 0 points    Immunizations Immunization History  Administered Date(s) Administered   Influenza, Quadrivalent, Recombinant, Inj, Pf 12/17/2016, 09/25/2017, 12/14/2018   PFIZER(Purple Top)SARS-COV-2 Vaccination 11/09/2020    TDAP status: Up to date  Flu Vaccine status: Due, Education has been provided regarding the importance of this vaccine. Advised may receive this vaccine at local pharmacy or Health Dept. Aware to provide a copy of the vaccination record if obtained from local pharmacy or Health Dept.  Verbalized acceptance and understanding.  Pneumococcal vaccine status: Due, Education has been provided regarding the importance of this vaccine. Advised may receive this vaccine at local pharmacy or Health Dept. Aware to provide a copy of the vaccination record if obtained from local pharmacy or Health Dept. Verbalized acceptance and understanding.  Covid-19 vaccine status: Completed vaccines  Qualifies for Shingles Vaccine? Yes   Zostavax completed No   Shingrix Completed?: No.    Education has been provided regarding the importance of this vaccine. Patient has been advised to call insurance company to determine out of pocket expense if they have not yet  received this vaccine. Advised may also receive vaccine at local pharmacy or Health Dept. Verbalized acceptance and understanding.  Screening Tests Health Maintenance  Topic Date Due   DTaP/Tdap/Td (1 - Tdap) Never done   Zoster Vaccines- Shingrix (1 of 2) Never done   COVID-19 Vaccine (2 - 2023-24 season) 07/12/2022   MAMMOGRAM  10/15/2022   INFLUENZA VACCINE  02/09/2023 (Originally 06/11/2022)   COLONOSCOPY (Pts 45-20yrs Insurance coverage will need to be confirmed)  07/05/2023 (Originally 04/01/2015)   Hepatitis C Screening  07/05/2023 (Originally 03/31/1988)   HIV Screening  07/05/2023 (Originally 03/31/1985)   PAP SMEAR-Modifier  01/14/2023   Medicare Annual Wellness (AWV)  10/17/2023   HPV VACCINES  Aged Out    Health Maintenance  Health Maintenance Due  Topic Date Due   DTaP/Tdap/Td (1 - Tdap) Never done   Zoster Vaccines- Shingrix (1 of 2) Never done   COVID-19 Vaccine (2 - 2023-24 season) 07/12/2022   MAMMOGRAM  10/15/2022    Colorectal cancer screening: Referral to GI placed 10/16/2022. Pt aware the office will call re: appt.  Mammogram status: Ordered 12/0/2023. Pt provided with contact info and advised to call to schedule appt.   Bone Density status: Ordered not of age . Pt provided with contact info and advised to call to schedule appt.  Lung Cancer Screening: (Low Dose CT Chest recommended if Age 70-80 years, 30 pack-year currently smoking OR have quit w/in 15years.) does not qualify.   Lung Cancer Screening Referral: n/a  Additional Screening:  Hepatitis C Screening: does not qualify;  Vision Screening: Recommended annual ophthalmology exams for early detection of glaucoma and other disorders of the eye. Is the patient up to date with their annual eye exam?  Yes  Who is the provider or what is the name of the office in which the patient attends annual eye exams? Dr.Wood  If pt is not established with a provider, would they like to be referred to a provider to  establish care? No .   Dental Screening: Recommended annual dental exams for proper oral hygiene  Community Resource Referral / Chronic Care Management: CRR required this visit?  No   CCM required this visit?  No      Plan:     I have personally reviewed and noted the following in the patient's chart:   Medical and social history Use of alcohol, tobacco or illicit drugs  Current medications and supplements including opioid prescriptions. Patient is not currently taking opioid prescriptions. Functional ability and status Nutritional status Physical activity Advanced directives List of other physicians Hospitalizations, surgeries, and ER visits in previous 12 months Vitals Screenings to include cognitive, depression, and falls Referrals and appointments  In addition, I have reviewed and discussed with patient certain preventive protocols, quality metrics, and best practice recommendations. A written personalized care plan for preventive services as well as general preventive health  recommendations were provided to patient.     Lorrene ReidLaura L Wilson, LPN   91/4/782912/04/2022   Nurse Notes: Due Flu vaccine / Pneumonia vaccine

## 2022-10-28 ENCOUNTER — Telehealth: Payer: Self-pay | Admitting: Family Medicine

## 2022-10-28 ENCOUNTER — Encounter: Payer: Self-pay | Admitting: Family Medicine

## 2022-10-28 NOTE — Telephone Encounter (Signed)
Over-the-counter she can take Mucinex and Flonase and sinus and cold medicine.  If she would like a prescription for the antiviral medicine Paxlovid then she will need to schedule a virtual appointment

## 2022-10-28 NOTE — Telephone Encounter (Signed)
Patient return call. ?

## 2022-10-28 NOTE — Telephone Encounter (Signed)
Patient tested positive for COVID with an at home test twice, would like to know if anything can be recommended for her to take. Please call back.

## 2022-10-28 NOTE — Telephone Encounter (Signed)
Lmtcb.

## 2022-10-28 NOTE — Telephone Encounter (Signed)
Patient aware of recommendations. She is requesting me ask you to see her for tele for antiviral since there is no appts today or tomorrow

## 2022-10-29 ENCOUNTER — Telehealth: Payer: Self-pay | Admitting: Family Medicine

## 2022-10-30 ENCOUNTER — Ambulatory Visit (INDEPENDENT_AMBULATORY_CARE_PROVIDER_SITE_OTHER): Payer: Medicare Other | Admitting: Nurse Practitioner

## 2022-10-30 ENCOUNTER — Encounter: Payer: Self-pay | Admitting: Nurse Practitioner

## 2022-10-30 DIAGNOSIS — U071 COVID-19: Secondary | ICD-10-CM

## 2022-10-30 MED ORDER — NIRMATRELVIR/RITONAVIR (PAXLOVID)TABLET: 3.0000 | ORAL_TABLET | Freq: Two times a day (BID) | ORAL | 0 refills | Status: AC

## 2022-10-30 MED ORDER — MOLNUPIRAVIR EUA 200MG CAPSULE: 4.0000 | ORAL_CAPSULE | Freq: Two times a day (BID) | ORAL | 0 refills | Status: AC

## 2022-10-30 NOTE — Patient Instructions (Signed)

## 2022-10-30 NOTE — Telephone Encounter (Signed)
Sent Paxlovid in for the patient

## 2022-10-30 NOTE — Telephone Encounter (Signed)
Left message advising paxlovid sent in and to call back with any further concerns or questions.

## 2022-10-30 NOTE — Progress Notes (Signed)
Virtual Visit  Note Due to COVID-19 pandemic this visit was conducted virtually. This visit type was conducted due to national recommendations for restrictions regarding the COVID-19 Pandemic (e.g. social distancing, sheltering in place) in an effort to limit this patient's exposure and mitigate transmission in our community. All issues noted in this document were discussed and addressed.  A physical exam was not performed with this format.  I connected with Cristina Soto on 10/30/22 at 1:01 by telephone and verified that I am speaking with the correct person using two identifiers. Cristina Soto is currently located at mobile and no one is currently with her during visit. The provider, Mary-Margaret Daphine Deutscher, FNP is located in their office at time of visit.  I discussed the limitations, risks, security and privacy concerns of performing an evaluation and management service by telephone and the availability of in person appointments. I also discussed with the patient that there may be a patient responsible charge related to this service. The patient expressed understanding and agreed to proceed.   History and Present Illness:  Cough This is a new problem. Episode onset: sunday. The problem has been gradually improving. The problem occurs hourly. The cough is Non-productive. Associated symptoms include postnasal drip and rhinorrhea. Pertinent negatives include no ear congestion, ear pain, fever, headaches or shortness of breath. Nothing (talking) aggravates the symptoms. Treatments tried: tylenol. The treatment provided moderate relief.   Positive covid test X3   Review of Systems  Constitutional:  Negative for fever.  HENT:  Positive for postnasal drip and rhinorrhea. Negative for ear pain.   Respiratory:  Positive for cough. Negative for shortness of breath.   Neurological:  Negative for headaches.     Observations/Objective: Alert and oriented- answers all questions  appropriately No distress Raspy voice  Assessment and Plan: Cristina Soto in today with chief complaint of Cough   1. Positive self-administered antigen test for COVID-19 1. Take meds as prescribed 2. Use a cool mist humidifier especially during the winter months and when heat has been humid. 3. Use saline nose sprays frequently 4. Saline irrigations of the nose can be very helpful if done frequently.  * 4X daily for 1 week*  * Use of a nettie pot can be helpful with this. Follow directions with this* 5. Drink plenty of fluids 6. Keep thermostat turn down low 7.For any cough or congestion- mucinex 8. For fever or aces or pains- take tylenol or ibuprofen appropriate for age and weight.  * for fevers greater than 101 orally you may alternate ibuprofen and tylenol every  3 hours.   Quarantine for 5 days Meds ordered this encounter  Medications   molnupiravir EUA (LAGEVRIO) 200 mg CAPS capsule    Sig: Take 4 capsules (800 mg total) by mouth 2 (two) times daily for 5 days.    Dispense:  40 capsule    Refill:  0    Order Specific Question:   Supervising Provider    Answer:   Arville Care A [[id number]]     Follow Up Instructions: prn    I discussed the assessment and treatment plan with the patient. The patient was provided an opportunity to ask questions and all were answered. The patient agreed with the plan and demonstrated an understanding of the instructions.   The patient was advised to call back or seek an in-person evaluation if the symptoms worsen or if the condition fails to improve as anticipated.  The above assessment and management plan  was discussed with the patient. The patient verbalized understanding of and has agreed to the management plan. Patient is aware to call the clinic if symptoms persist or worsen. Patient is aware when to return to the clinic for a follow-up visit. Patient educated on when it is appropriate to go to the emergency department.    Time call ended:  1:14  I provided 13 minutes of  non face-to-face time during this encounter.    Mary-Margaret Daphine Deutscher, FNP

## 2022-10-30 NOTE — Addendum Note (Signed)
Addended by: Arville Care on: 10/30/2022 12:38 PM   Modules accepted: Orders

## 2022-11-14 ENCOUNTER — Encounter: Payer: Medicare Other | Attending: Psychology | Admitting: Psychology

## 2022-11-14 DIAGNOSIS — G43001 Migraine without aura, not intractable, with status migrainosus: Secondary | ICD-10-CM | POA: Diagnosis present

## 2022-11-14 DIAGNOSIS — F064 Anxiety disorder due to known physiological condition: Secondary | ICD-10-CM

## 2022-11-14 DIAGNOSIS — F0630 Mood disorder due to known physiological condition, unspecified: Secondary | ICD-10-CM

## 2022-11-17 ENCOUNTER — Encounter: Payer: Self-pay | Admitting: Psychology
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
## 2022-12-15 ENCOUNTER — Encounter: Payer: Self-pay | Admitting: Family Medicine

## 2022-12-18 ENCOUNTER — Telehealth: Payer: Self-pay | Admitting: Family Medicine

## 2022-12-18 DIAGNOSIS — R41840 Attention and concentration deficit: Secondary | ICD-10-CM

## 2022-12-18 DIAGNOSIS — F4320 Adjustment disorder, unspecified: Secondary | ICD-10-CM

## 2022-12-18 NOTE — Telephone Encounter (Signed)
R/c

## 2022-12-18 NOTE — Telephone Encounter (Signed)
Pt is in the process of moving and states that her ADHD is very heightened. She has severe brain fog. She would like to start back on the Wellbutrin but would like to start with a smaller dose and increase to the 150mg  gradually. Pt is questioning whether she could try a child dose first because she is so sensitive to medications. Will send message to Dettinger to review.

## 2022-12-18 NOTE — Telephone Encounter (Signed)
Left message for pt to return call.

## 2022-12-19 MED ORDER — BUPROPION HCL 75 MG PO TABS: 75.0000 mg | ORAL_TABLET | Freq: Two times a day (BID) | ORAL | 1 refills | Status: DC

## 2022-12-19 NOTE — Telephone Encounter (Signed)
Left message on cell of Dr Dettinger's recommendations and to call back if needed.

## 2022-12-19 NOTE — Telephone Encounter (Signed)
Sent Wellbutrin 75 mg to her pharmacy, she can start by taking it once a day and then work up to taking it twice a day and then we can gradually shift onto 150 mg once a day in the future.

## 2022-12-19 NOTE — Telephone Encounter (Signed)
Left vm for cb

## 2022-12-23 NOTE — Telephone Encounter (Signed)
Okay that is fine, let us know how it goes

## 2022-12-23 NOTE — Telephone Encounter (Signed)
Pt returned missed call. Says she wants to be on the very minimum dosage of this medicine so she is going to start taking the 51m every other day and see how she feels with that first. Will try this for a few weeks and will call office with an update.

## 2022-12-24 ENCOUNTER — Ambulatory Visit: Payer: Medicare Other

## 2023-01-10 ENCOUNTER — Other Ambulatory Visit: Payer: Self-pay | Admitting: Family Medicine

## 2023-01-10 DIAGNOSIS — F4320 Adjustment disorder, unspecified: Secondary | ICD-10-CM

## 2023-01-10 DIAGNOSIS — R41840 Attention and concentration deficit: Secondary | ICD-10-CM

## 2023-02-04 ENCOUNTER — Ambulatory Visit: Payer: Medicare Other

## 2023-02-13 ENCOUNTER — Telehealth: Payer: Self-pay

## 2023-02-13 NOTE — Telephone Encounter (Signed)
Patient called in voice mail stating she had a question for Marisa Sprinkles., NP.    I called patient and made her aware TW out of office until Monday. She is fine with waiting for her return.  Patient c/0 "Constant emotions and constant achiness from fibromyalgia.Has some hard decisions in life.  "Used to it was just PMS and I would have 15 great days a month. Now every day I am overwhelmed. When overwhelmed I cry and then it is hard to center."  She mentioned she has friends taking hormones. She wants to see what options she has.

## 2023-02-17 NOTE — Telephone Encounter (Signed)
Please have her make OV to discuss options.

## 2023-02-17 NOTE — Telephone Encounter (Signed)
Spoke with patient and informed her. Agreeable to appt. Message sent to appointment desk to schedule visit.

## 2023-02-18 ENCOUNTER — Ambulatory Visit: Payer: Medicare Other | Admitting: Nurse Practitioner

## 2023-02-18 NOTE — Progress Notes (Deleted)
   Acute Office Visit  Subjective:    Patient ID: Cristina Soto, female    DOB: 1971-03-21, 52 y.o.   MRN: [id number]   HPI 52 y.o. presents today for anxiety with panic attacks, brain fog, and generalized pain that she suspects is from fibromyalgia. Previous discussions had regarding mood changes and possible perimenopausal symptoms. Did not tolerate Prozac, zoloft?, declines HRT. See previous office visit notes regarding history and recommendations. Most of her symptoms began during the pandemic due to isolation.    Review of Systems     Objective:    Physical Exam  LMP 05/22/2021  Wt Readings from Last 3 Encounters:  10/16/22 138 lb (62.6 kg)  10/14/22 140 lb (63.5 kg)  10/02/22 140 lb (63.5 kg)        Patient informed chaperone available to be present for breast and/or pelvic exam. Patient has requested no chaperone to be present. Patient has been advised what will be completed during breast and pelvic exam.   Assessment & Plan:   Problem List Items Addressed This Visit   None       Olivia Mackie DNP, 7:42 AM 02/18/2023

## 2023-03-03 ENCOUNTER — Ambulatory Visit: Payer: Medicare Other | Admitting: Nurse Practitioner

## 2023-03-10 ENCOUNTER — Ambulatory Visit (INDEPENDENT_AMBULATORY_CARE_PROVIDER_SITE_OTHER): Payer: Medicare Other | Admitting: Nurse Practitioner

## 2023-03-10 ENCOUNTER — Encounter: Payer: Self-pay | Admitting: Nurse Practitioner

## 2023-03-10 VITALS — BP 110/68 | HR 75 | Ht 64.0 in | Wt 148.0 lb

## 2023-03-10 DIAGNOSIS — F410 Panic disorder [episodic paroxysmal anxiety] without agoraphobia: Secondary | ICD-10-CM

## 2023-03-10 DIAGNOSIS — F411 Generalized anxiety disorder: Secondary | ICD-10-CM | POA: Diagnosis not present

## 2023-03-10 DIAGNOSIS — R4189 Other symptoms and signs involving cognitive functions and awareness: Secondary | ICD-10-CM

## 2023-03-10 DIAGNOSIS — N951 Menopausal and female climacteric states: Secondary | ICD-10-CM | POA: Diagnosis not present

## 2023-03-10 MED ORDER — PROGESTERONE MICRONIZED 100 MG PO CAPS: 100.0000 mg | ORAL_CAPSULE | Freq: Every day | ORAL | 0 refills | Status: DC

## 2023-03-10 NOTE — Progress Notes (Signed)
   Acute Office Visit  Subjective:    Patient ID: Cristina Soto, female    DOB: 1970/12/25, 52 y.o.   MRN: [id number]   HPI 52 y.o. presents today to discuss emotional state and HRT. Has been struggling with moods for a few years, since pandemic. Also going through perimenopause during that time. Has lots of work stress, boyfriend moved to [place] and he is a big support for her. Seeing therapy with plans to increase to twice weekly sessions. Has not tolerated estradiol patch, Prozac, Zoloft, Wellbutrin. Neuro mag recommended for brain fog and she liked it but not consistent with use. Stays at a couple of different places and cares for parents so she does not always have her medications/supplements with her. H/O migraine without aura.   Patient's last menstrual period was 05/22/2021.    Review of Systems  Constitutional: Negative.   Genitourinary: Negative.   Psychiatric/Behavioral:  Positive for decreased concentration and dysphoric mood. The patient is nervous/anxious.        Objective:    Physical Exam Constitutional:      Appearance: Normal appearance.  Psychiatric:        Attention and Perception: She is inattentive.        Mood and Affect: Mood is anxious.        Speech: Speech normal.        Behavior: Behavior normal.        Thought Content: Thought content normal.        Cognition and Memory: Cognition normal.     BP 110/68   Pulse 75   Ht 5\' 4"  (1.626 m)   Wt 148 lb (67.1 kg)   LMP 05/22/2021   SpO2 100%   BMI 25.40 kg/m  Wt Readings from Last 3 Encounters:  03/10/23 148 lb (67.1 kg)  10/16/22 138 lb (62.6 kg)  10/14/22 140 lb (63.5 kg)        Patient informed chaperone available to be present for breast and/or pelvic exam. Patient has requested no chaperone to be present. Patient has been advised what will be completed during breast and pelvic exam.   Assessment & Plan:   Problem List Items Addressed This Visit   None Visit Diagnoses      Menopausal symptoms    -  Primary   Relevant Medications   progesterone (PROMETRIUM) 100 MG capsule   Generalized anxiety disorder with panic attacks       Brain fog          Plan: Discussed management of moods with therapy, low dose SSRI, HRT, self care, to do lists, etc. Intolerance to multiple medications tried in the past, including estradiol. Will start Prometrium 100 mg nightly, Vit D 1000 IU daily, and restart neuro mag. Recommend keeping medications in bag that she carries to make sure she has them available, set alarms.     Olivia Mackie DNP, 4:21 PM 03/10/2023

## 2023-03-12 ENCOUNTER — Other Ambulatory Visit: Payer: Self-pay | Admitting: Family Medicine

## 2023-03-12 DIAGNOSIS — R921 Mammographic calcification found on diagnostic imaging of breast: Secondary | ICD-10-CM

## 2023-03-24 ENCOUNTER — Encounter: Payer: Self-pay | Admitting: Nurse Practitioner

## 2023-03-24 ENCOUNTER — Ambulatory Visit: Payer: Medicare Other

## 2023-03-25 ENCOUNTER — Ambulatory Visit: Payer: Medicare Other

## 2023-03-26 ENCOUNTER — Ambulatory Visit
Admission: RE | Admit: 2023-03-26 | Discharge: 2023-03-26 | Disposition: A | Discharge status: Home / Self Care | Payer: Medicare Other | Source: Ambulatory Visit | Attending: Family Medicine | Admitting: Family Medicine

## 2023-03-26 DIAGNOSIS — R921 Mammographic calcification found on diagnostic imaging of breast: Secondary | ICD-10-CM

## 2023-03-31 ENCOUNTER — Ambulatory Visit
Admission: EM | Admit: 2023-03-31 | Discharge: 2023-03-31 | Disposition: A | Discharge status: Home / Self Care | Payer: Medicare Other

## 2023-03-31 DIAGNOSIS — R21 Rash and other nonspecific skin eruption: Secondary | ICD-10-CM

## 2023-03-31 MED ORDER — CLINDAMYCIN PHOS-BENZOYL PEROX 1-5 % EX GEL: Freq: Two times a day (BID) | CUTANEOUS | 0 refills | Status: DC

## 2023-03-31 NOTE — ED Provider Notes (Signed)
[facility]  Note:  This document was prepared using Conservation officer, historic buildings and may include unintentional dictation errors.  MRN: [id number] DOB: [birth year]  Subjective:   Cristina Soto is a 52 y.o. female presenting for 2-week history of persistent intermittent irritating lesions of her face.  Patient reports that these are flared up according to her apartment.  When she spent some weight from her apartment the lesions resolved.  She has previously been prescribed different kinds of topical treatments for the face and cannot recall exactly which one works.  No current facility-administered medications for this encounter.  Current Outpatient Medications:    VITAMIN D, CHOLECALCIFEROL, PO, Take by mouth., Disp: , Rfl:    Acetaminophen (TYLENOL) 325 MG CAPS, Take by mouth as needed., Disp: , Rfl:    aspirin-acetaminophen-caffeine (EXCEDRIN MIGRAINE) 250-250-65 MG tablet, Take 2 tablets by mouth as needed for headache., Disp: , Rfl:    buPROPion (WELLBUTRIN) 75 MG tablet, TAKE 1 TABLET BY MOUTH TWICE A DAY (Patient not taking: Reported on 03/10/2023), Disp: 180 tablet, Rfl: 0   Ibuprofen (ADVIL PO), Take by mouth as needed., Disp: , Rfl:    magnesium 30 MG tablet, Take 30 mg by mouth 2 (two) times daily., Disp: , Rfl:    progesterone (PROMETRIUM) 100 MG capsule, Take 1 capsule (100 mg total) by mouth daily., Disp: 90 capsule, Rfl: 0   Allergies  Allergen Reactions   Corticosteroids Nausea And Vomiting   Erythromycin Nausea And Vomiting   Promethazine Hcl Nausea And Vomiting    Phenergan   Prozac [Fluoxetine] Other (See Comments)    Increased agitation/frustration    Past Medical History:  Diagnosis Date   Anxiety    Asthma    Chronic fatigue fibromyalgia syndrome    CIN I (cervical intraepithelial neoplasia I) 2002   Depression    Fibromyalgia    Hemorrhoids    Migraine    Vulvar pruritus      Past Surgical History:  Procedure  Laterality Date   COLPOSCOPY  2002   DILATION AND CURETTAGE OF UTERUS  1997   TH.AB    Family History  Problem Relation Age of Onset   Asthma Mother    Diverticulitis Father    Diverticulitis Sister    Alcohol abuse Sister    Cancer Paternal Grandmother        Lung   Cancer Paternal Grandfather        Liver   Colon cancer Neg Hx    Breast cancer Neg Hx    Colon polyps Neg Hx    Stomach cancer Neg Hx    Esophageal cancer Neg Hx    Pancreatic cancer Neg Hx     Social History   Tobacco Use   Smoking status: Never   Smokeless tobacco: Never  Vaping Use   Vaping Use: Never used  Substance Use Topics   Alcohol use: Not Currently    Alcohol/week: 0.0 standard drinks of alcohol   Drug use: No    ROS   Objective:   Vitals: BP 117/76 (BP Location: Right Arm)   Pulse 75   Temp 98 F (36.7 C) (Oral)   Resp 16   LMP 05/22/2021   SpO2 98%   Physical Exam Constitutional:      General: She is not in acute distress.    Appearance: Normal appearance. She is well-developed. She is not ill-appearing, toxic-appearing or diaphoretic.  HENT:     Head: Normocephalic and atraumatic.  Comments: Multiple scattered inflamed comedone lesions.  No facial swelling, oral swelling, areas of erythema or induration, signs of abscess.    Nose: Nose normal.     Mouth/Throat:     Mouth: Mucous membranes are moist.  Eyes:     General: No scleral icterus.       Right eye: No discharge.        Left eye: No discharge.     Extraocular Movements: Extraocular movements intact.  Cardiovascular:     Rate and Rhythm: Normal rate.  Pulmonary:     Effort: Pulmonary effort is normal.  Skin:    General: Skin is warm and dry.  Neurological:     General: No focal deficit present.     Mental Status: She is alert and oriented to person, place, and time.  Psychiatric:        Mood and Affect: Mood normal.        Behavior: Behavior normal.     Assessment and Plan :   PDMP not reviewed this  encounter.  1. Facial rash     Discussed possibility of an irritant dermatitis versus cystic acne.  Recommended that patient do deep cleaning of her apartment.  Will use clindamycin benzoyl peroxide for suspected cystic acne. Counseled patient on potential for adverse effects with medications prescribed/recommended today, ER and return-to-clinic precautions discussed, patient verbalized understanding.    Wallis Bamberg, PA-C 04/01/23 0800

## 2023-03-31 NOTE — ED Triage Notes (Signed)
Pt reports rash in the face x 2 weeks. Reports only happens when she is in her apt.

## 2023-04-24 NOTE — Telephone Encounter (Signed)
Mychart msg returned unread. Spoke w/ pt via phone and she reports that she stopped taking the progesterone completely and has been doing ok except for weight gain has increased significantly per pt since stopping.   Pt also reports that a while back she had used a topical form of progesterone and when she stopped, she experienced the weight gain. So, wants to inquire on what you would recommend?  Pt also wanted me to thank you in regards to recommending that she take magnesium. States that she seems to have a lot more energy since she started and is very grateful. States she is still waking up with some anxious sxs and some scattered throughout the day but is overall doing better.   Pt advised TW out for vacation but will return on 6/17. Pt ok to wait for response from TW until then.

## 2023-04-28 NOTE — Telephone Encounter (Signed)
Pt notified and voiced understanding. Will route to provider for final review and close.

## 2023-04-28 NOTE — Telephone Encounter (Signed)
I do not think there is a correlation between stopping the progesterone and weight gain. She was on it very briefly. It is important that she have a calorie deficit for weight loss. Increase activity and look into intermittent fasting. Glad she is doing well on the magnesium.

## 2023-06-07 ENCOUNTER — Other Ambulatory Visit: Payer: Self-pay | Admitting: Nurse Practitioner

## 2023-06-07 DIAGNOSIS — N951 Menopausal and female climacteric states: Secondary | ICD-10-CM

## 2023-06-09 NOTE — Telephone Encounter (Signed)
I am pretty sure she stopped taking. Please confirm.

## 2023-06-09 NOTE — Telephone Encounter (Signed)
Sent MyChart msg to pt.

## 2023-06-09 NOTE — Telephone Encounter (Signed)
Med refill request: Progesterone Last AEX: 03/10/23 Next AEX: n/a Last MMG (if hormonal med) 03/26/23 Refill authorized: Please Advise, #[phone], 0RF

## 2023-07-03 ENCOUNTER — Encounter: Payer: Medicare Other | Attending: Psychology | Admitting: Psychology

## 2023-07-03 DIAGNOSIS — F4323 Adjustment disorder with mixed anxiety and depressed mood: Secondary | ICD-10-CM | POA: Diagnosis not present

## 2023-07-03 DIAGNOSIS — F064 Anxiety disorder due to known physiological condition: Secondary | ICD-10-CM | POA: Diagnosis present

## 2023-07-03 DIAGNOSIS — G43001 Migraine without aura, not intractable, with status migrainosus: Secondary | ICD-10-CM | POA: Insufficient documentation

## 2023-07-03 DIAGNOSIS — F0630 Mood disorder due to known physiological condition, unspecified: Secondary | ICD-10-CM | POA: Insufficient documentation

## 2023-08-05 ENCOUNTER — Encounter: Payer: Self-pay | Admitting: Nurse Practitioner

## 2023-08-05 ENCOUNTER — Telehealth: Payer: Medicare Other | Admitting: Nurse Practitioner

## 2023-08-05 DIAGNOSIS — T63301A Toxic effect of unspecified spider venom, accidental (unintentional), initial encounter: Secondary | ICD-10-CM

## 2023-08-05 MED ORDER — SULFAMETHOXAZOLE-TRIMETHOPRIM 800-160 MG PO TABS
1.0000 | ORAL_TABLET | Freq: Two times a day (BID) | ORAL | 0 refills | Status: DC
Start: 2023-08-05 — End: 2024-08-19

## 2023-08-05 NOTE — Progress Notes (Signed)
Virtual Visit Consent   [name], you are scheduled for a virtual visit with Mary-Margaret Daphine Deutscher, FNP, a [facility] provider, today.     Just as with appointments in the office, your consent must be obtained to participate.  Your consent will be active for this visit and any virtual visit you may have with one of our providers in the next 365 days.     If you have a MyChart account, a copy of this consent can be sent to you electronically.  All virtual visits are billed to your insurance company just like a traditional visit in the office.    As this is a virtual visit, video technology does not allow for your provider to perform a traditional examination.  This may limit your provider's ability to fully assess your condition.  If your provider identifies any concerns that need to be evaluated in person or the need to arrange testing (such as labs, EKG, etc.), we will make arrangements to do so.     Although advances in technology are sophisticated, we cannot ensure that it will always work on either your end or our end.  If the connection with a video visit is poor, the visit may have to be switched to a telephone visit.  With either a video or telephone visit, we are not always able to ensure that we have a secure connection.     I need to obtain your verbal consent now.   Are you willing to proceed with your visit today? YES   Cristina Soto has provided verbal consent on 08/05/2023 for a virtual visit (video or telephone).   Mary-Margaret Daphine Deutscher, FNP   Date: 08/05/2023 1:21 PM   Virtual Visit via Video Note   I, Mary-Margaret Daphine Deutscher, connected with Cristina Soto ([id number], 28-Oct-1971) on 08/05/23 at  5:30 PM EDT by a video-enabled telemedicine application and verified that I am speaking with the correct person using two identifiers.  Location: Patient: Virtual Visit Location Patient: Home Provider: Virtual Visit Location Provider: Mobile   I discussed the  limitations of evaluation and management by telemedicine and the availability of in person appointments. The patient expressed understanding and agreed to proceed.    History of Present Illness: Cristina Soto is a 52 y.o. who identifies as a female who was assigned female at birth, and is being seen today for spider bite.  HPI: Patient had a spider bite on her her face. Swollen and sore to touch. Skin in area is peeling. She had similar bite last year. No drainage. Sore to touch. Feels warm.    Review of Systems  Constitutional:  Negative for chills and fever.    Problems:  Patient Active Problem List   Diagnosis Date Noted   Chronic sinusitis 11/13/2018   Hoarseness of voice 11/13/2018   Migraine 04/30/2017   Anxiety 04/30/2017   Constipation 04/30/2017   Excessive cerumen in both ear canals 05/02/2016   Referred otalgia of both ears 05/02/2016   Hemorrhoids 04/01/2012    Allergies:  Allergies  Allergen Reactions   Corticosteroids Nausea And Vomiting   Erythromycin Nausea And Vomiting   Promethazine Hcl Nausea And Vomiting    Phenergan   Prozac [Fluoxetine] Other (See Comments)    Increased agitation/frustration   Medications:  Current Outpatient Medications:    Acetaminophen (TYLENOL) 325 MG CAPS, Take by mouth as needed., Disp: , Rfl:    aspirin-acetaminophen-caffeine (EXCEDRIN MIGRAINE) 250-250-65 MG tablet, Take 2 tablets by mouth  as needed for headache., Disp: , Rfl:    buPROPion (WELLBUTRIN) 75 MG tablet, TAKE 1 TABLET BY MOUTH TWICE A DAY (Patient not taking: Reported on 03/10/2023), Disp: 180 tablet, Rfl: 0   clindamycin-benzoyl peroxide (BENZACLIN) gel, Apply topically 2 (two) times daily., Disp: 50 g, Rfl: 0   Ibuprofen (ADVIL PO), Take by mouth as needed., Disp: , Rfl:    magnesium 30 MG tablet, Take 30 mg by mouth 2 (two) times daily., Disp: , Rfl:    progesterone (PROMETRIUM) 100 MG capsule, Take 1 capsule (100 mg total) by mouth daily., Disp: 90 capsule,  Rfl: 0   VITAMIN D, CHOLECALCIFEROL, PO, Take by mouth., Disp: , Rfl:   Observations/Objective: Patient is well-developed, well-nourished in no acute distress.  Resting comfortably  at home.  Head is normocephalic, atraumatic.  No labored breathing.  Speech is clear and coherent with logical content.  Patient is alert and oriented at baseline.  [place] went out- erythematous raised hard lesion on left cheek  Assessment and Plan:  [name] in today with chief complaint of No chief complaint on file.   1. Spider bite wound, accidental or unintentional, initial encounter Avoid picking Warm compresses If not improving RTO prn - sulfamethoxazole-trimethoprim (BACTRIM DS) 800-160 MG tablet; Take 1 tablet by mouth 2 (two) times daily.  Dispense: 20 tablet; Refill: 0    Follow Up Instructions: I discussed the assessment and treatment plan with the patient. The patient was provided an opportunity to ask questions and all were answered. The patient agreed with the plan and demonstrated an understanding of the instructions.  A copy of instructions were sent to the patient via MyChart.  The patient was advised to call back or seek an in-person evaluation if the symptoms worsen or if the condition fails to improve as anticipated.  Time:  I spent 10 minutes with the patient via telehealth technology discussing the above problems/concerns.    Mary-Margaret Daphine Deutscher, FNP

## 2023-08-05 NOTE — Patient Instructions (Signed)
Spider Bite Spider bites are not common. Most spider bites do not cause serious problems. There are only a few types of spider bites that can cause serious health problems. What are the causes? A spider bite is often caused by a person accidentally making contact with a spider in a way that traps the spider against the skin. What increases the risk? You are more likely to be bitten by a spider if: You live in an area where spiders live, and you disturb their habitat. You work outdoors, such as a Clinical biochemist. You do certain outdoor activities, such as playing in leaves or hiking. What are the signs or symptoms? Some spider bites may cause symptoms within 1 hour after the bite. For other spider bites, it may take 1-2 days for symptoms to appear. Symptoms include: A raised area that is red. Redness and swelling around the area of the bite. Pain in the area of the bite. A few types of spiders, such as the black widow or the brown recluse, can inject poison (venom) into a bite wound. This causes more serious symptoms. Symptoms of these bites vary and may include: Muscle cramps. Feeling like you may vomit (nauseous). Vomiting. Pain in your belly (abdomen). A fever. A skin sore (lesion) that spreads. This can break into an open wound (skin ulcer). Feeling light-headed or dizzy. How is this treated? Many spider bites do not need treatment. If needed, treatment may include: Icing and keeping the bite area raised (elevated). Taking or applying over-the-counter or prescription medicines to help with symptoms such as pain and itching. Having a tetanus shot. Taking antibiotic medicine. Follow these instructions at home: Medicines Take or apply over-the-counter and prescription medicines only as told by your doctor. If you were prescribed an antibiotic medicine, take or apply it as told by your doctor. Do not stop using it even if you start to feel better. Managing pain and swelling  If  told, put ice on the bite area. To do this: Put ice in a plastic bag. Place a towel between your skin and the bag. Leave the ice on for 20 minutes, 2-3 times a day. Take off the ice if your skin turns bright red. This is very important. If you cannot feel pain, heat, or cold, you have a greater risk of damage to the area. Raise the bite area above the level of your heart while you are sitting or lying down. General instructions  Do not scratch the bite area. Keep the bite area clean and dry. Wash the bite area with soap and water each day as told by your doctor. Keep all follow-up visits. Contact a doctor if: Your bite does not get better after 3 days. Your bite turns black or purple. Near the bite, you have more: Redness. Swelling. Pain. Get help right away if: You get shortness of breath or chest pain. You have fluid, blood, or pus coming from the bite area. You have painful muscle cramps or sudden muscle tightening (spasms). You have belly pain. You feel like you may vomit or you vomit. You feel more tired or sleepy than normal. These symptoms may be an emergency. Get help right away. Call your local emergency services (911 in the [place] Do not wait to see if the symptoms will go away. Do not drive yourself to the hospital. Summary Spider bites are not common. Most spider bites do not cause serious health problems. Take or apply all medicines only as told by your doctor.  Keep the bite area clean and dry. Wash the bite area with soap and water each day as told by your doctor. Contact a doctor if you have more redness, swelling, or pain near the bite. Get help right away if you get shortness of breath or chest pain. This information is not intended to replace advice given to you by your health care provider. Make sure you discuss any questions you have with your health care provider. Document Revised: 08/16/2020 Document Reviewed: 08/16/2020 Elsevier Patient Education  2024  [employer].

## 2023-08-06 ENCOUNTER — Encounter: Payer: Self-pay | Admitting: Psychology
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
## 2023-08-28 ENCOUNTER — Encounter: Payer: Self-pay | Admitting: Nurse Practitioner

## 2023-09-02 ENCOUNTER — Encounter: Payer: Medicare Other | Attending: Psychology | Admitting: Psychology

## 2023-09-02 DIAGNOSIS — F0630 Mood disorder due to known physiological condition, unspecified: Secondary | ICD-10-CM | POA: Insufficient documentation

## 2023-09-02 DIAGNOSIS — F419 Anxiety disorder, unspecified: Secondary | ICD-10-CM | POA: Insufficient documentation

## 2023-09-02 DIAGNOSIS — G43001 Migraine without aura, not intractable, with status migrainosus: Secondary | ICD-10-CM | POA: Diagnosis not present

## 2023-09-02 DIAGNOSIS — F064 Anxiety disorder due to known physiological condition: Secondary | ICD-10-CM | POA: Diagnosis present

## 2023-10-10 ENCOUNTER — Encounter: Payer: Self-pay | Admitting: Psychology
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
## 2023-11-05 IMAGING — MG MM DIGITAL SCREENING BILAT W/ TOMO AND CAD
6 of 10 series · 6 of 30 positions shown · non-contrast
Comparison: Previous exam(s).

CLINICAL DATA: Screening.

EXAM:
DIGITAL SCREENING BILATERAL MAMMOGRAM WITH TOMOSYNTHESIS AND CAD
TECHNIQUE: Bilateral screening digital craniocaudal and mediolateral oblique
mammograms were obtained. Bilateral screening digital breast
tomosynthesis was performed. The images were evaluated with
computer-aided detection.

[R CC synth-2D (1 of 2)]
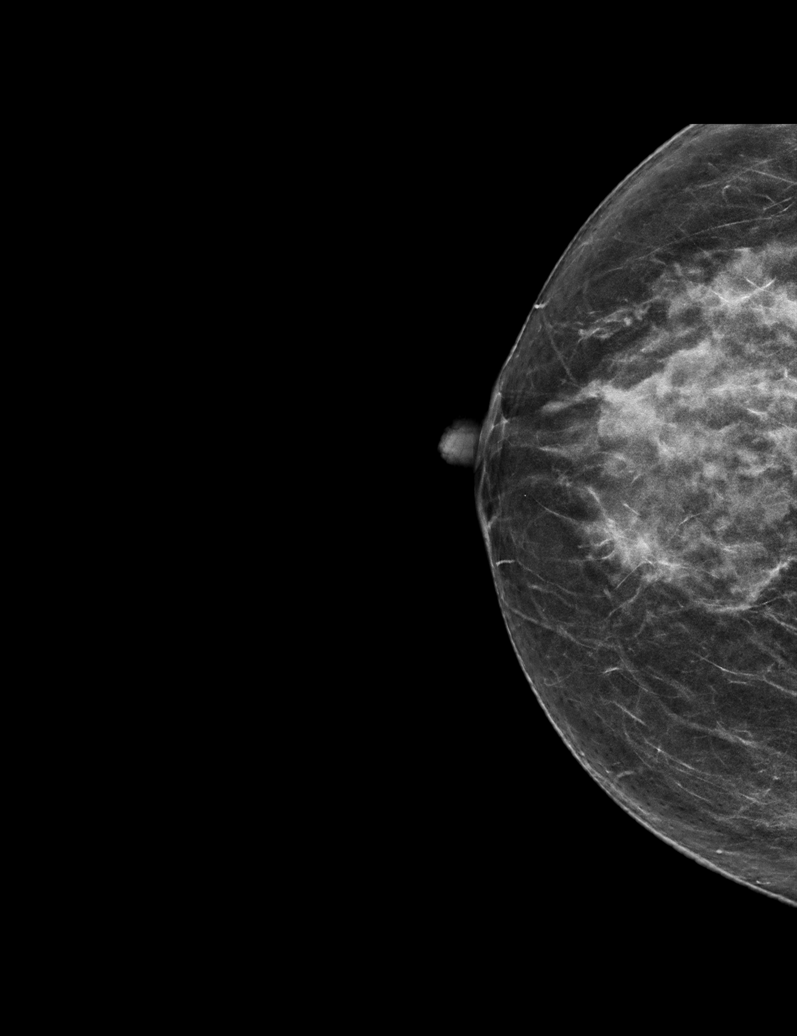

[L MLO synth-2D]
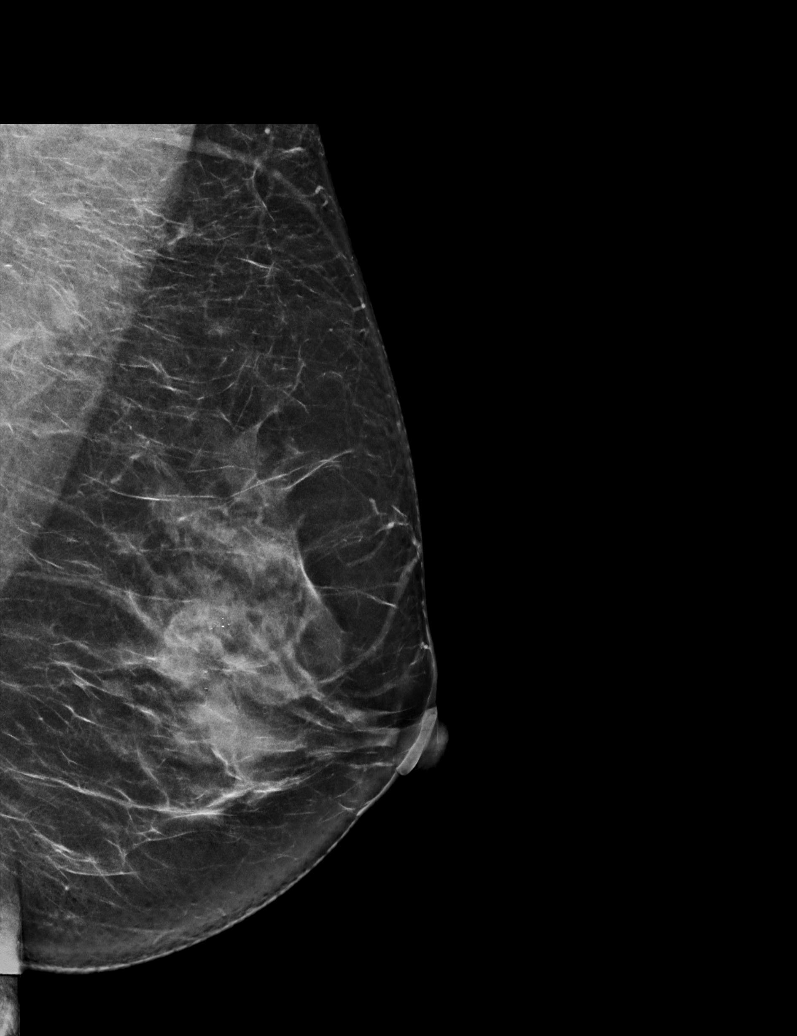

[R CC synth-2D (2 of 2)]
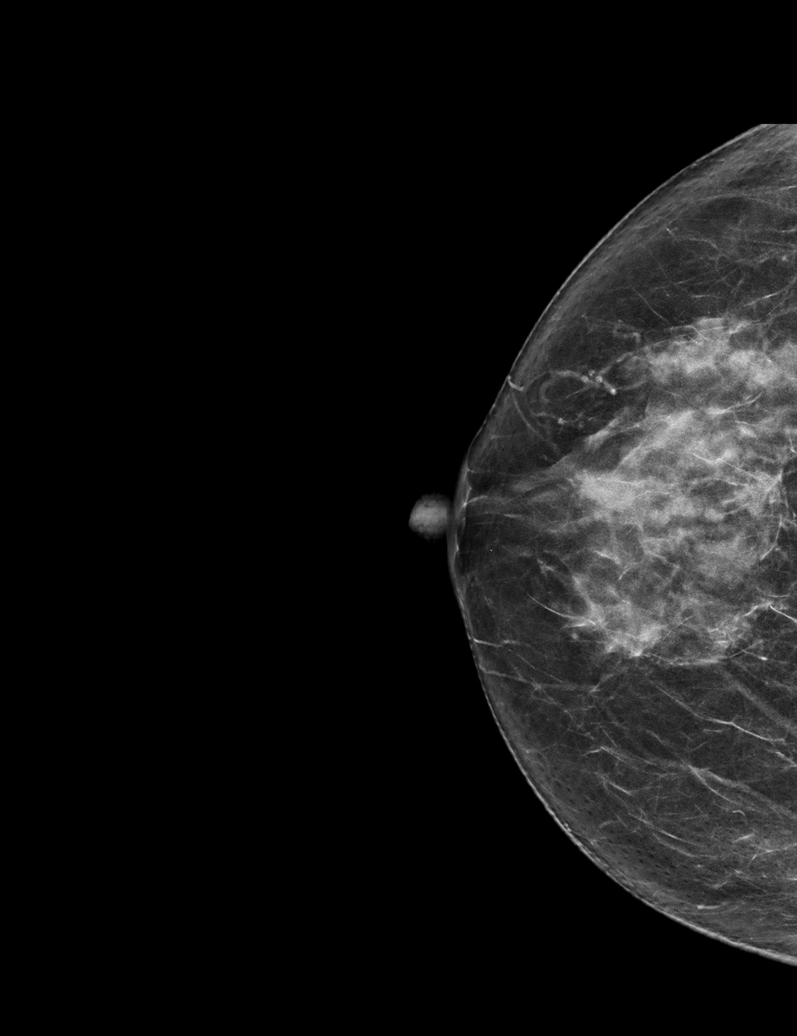

[L CC synth-2D]
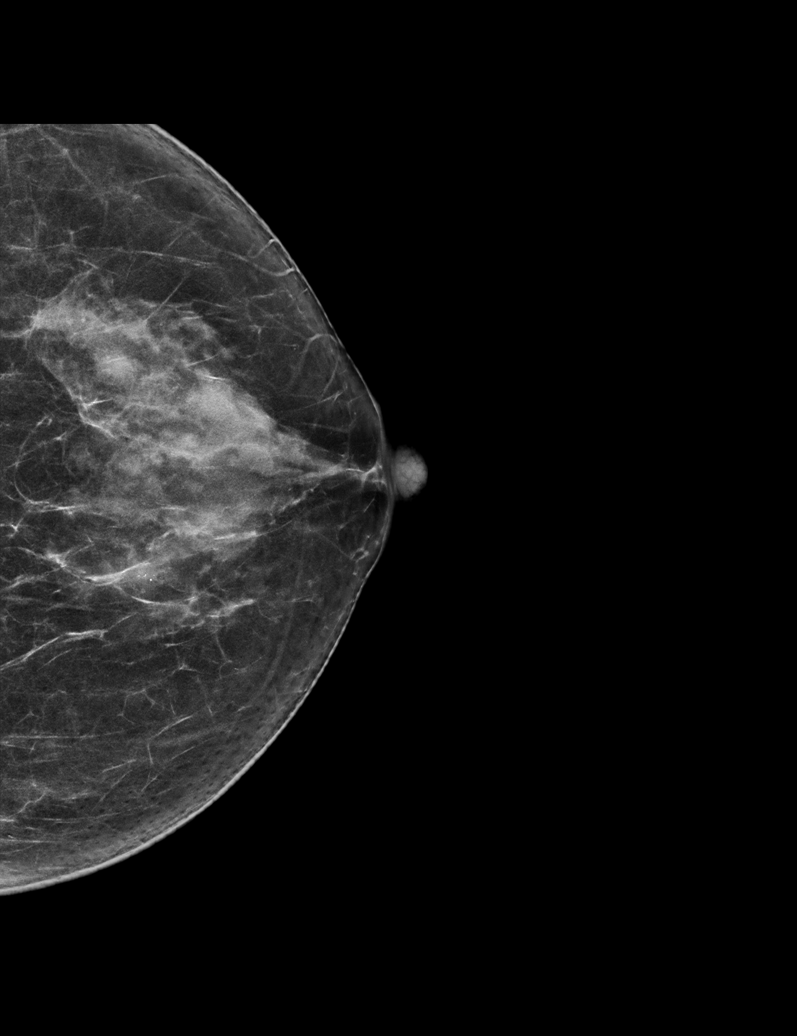

[R MLO synth-2D]
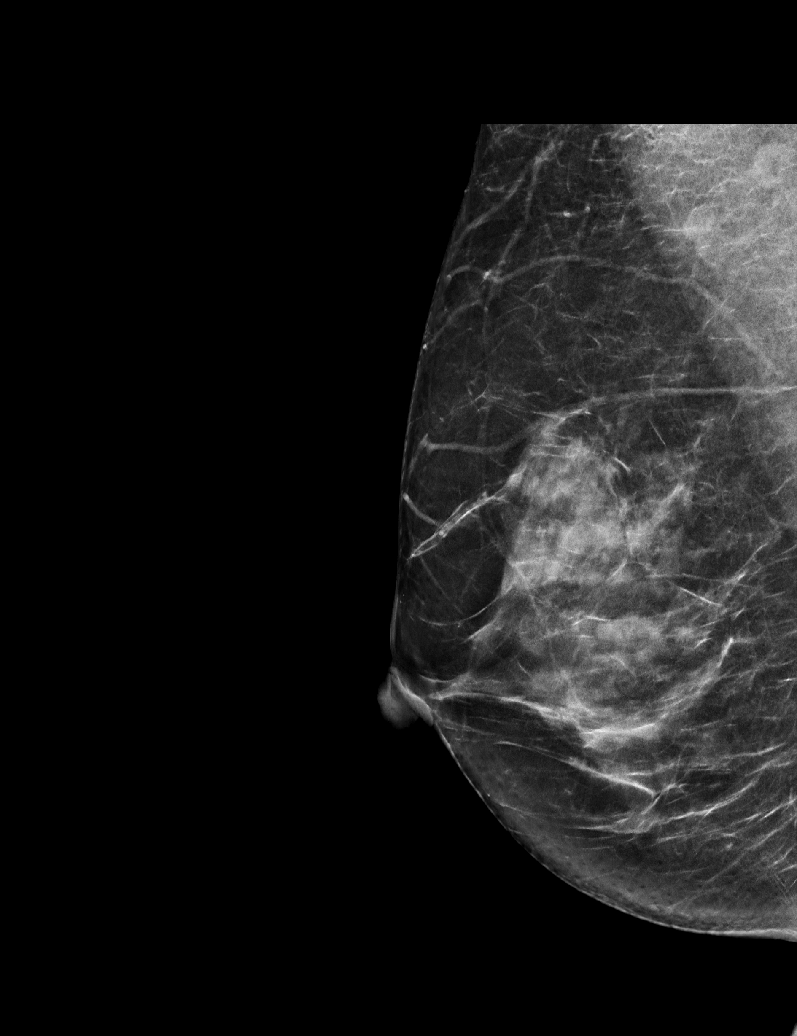

[R MLO tomo · tomo slice 35/68.0]
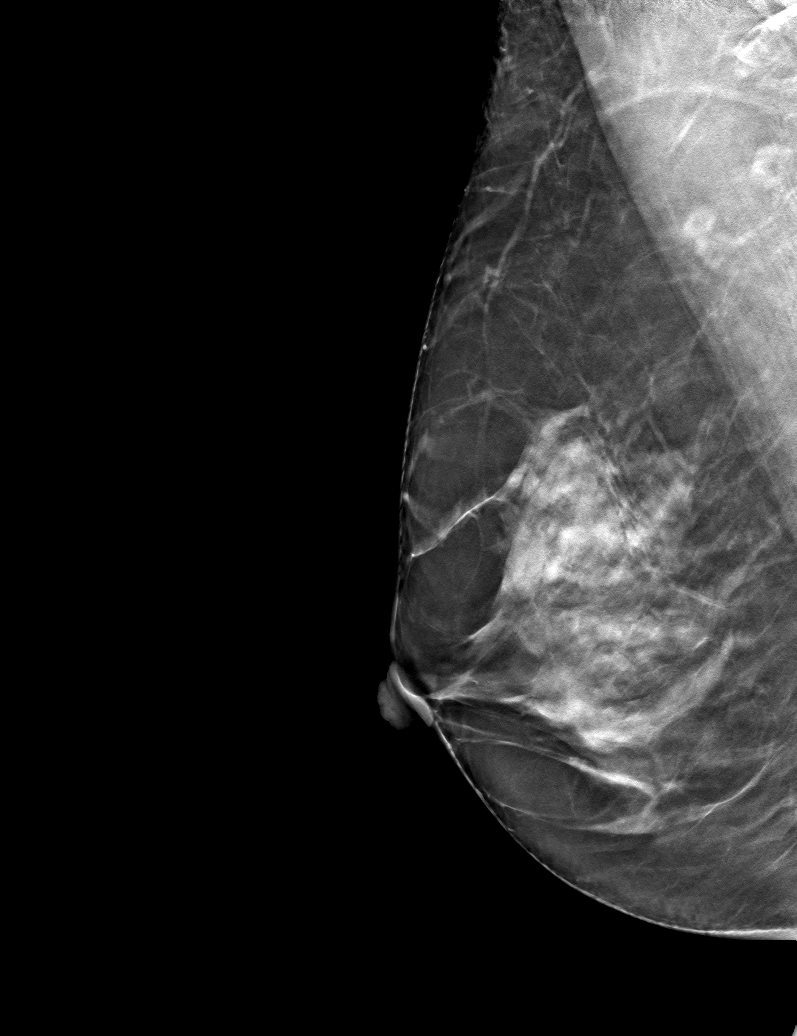

[6 of 30 positions shown; findings below may reference images not displayed]

ACR Breast Density Category c: The breast tissue is heterogeneously
dense, which may obscure small masses.
FINDINGS: In the left breast, calcifications warrant further evaluation. In
the right breast, no findings suspicious for malignancy.
IMPRESSION: Further evaluation is suggested for calcifications in the left
breast.

RECOMMENDATION:
Diagnostic mammogram of the left breast. (Code:AS-B-FFA)

The patient will be contacted regarding the findings, and additional
imaging will be scheduled.

BI-RADS CATEGORY  0: Incomplete. Need additional imaging evaluation
and/or prior mammograms for comparison.

## 2023-11-06 ENCOUNTER — Telehealth: Payer: Self-pay | Admitting: Family Medicine

## 2023-11-06 NOTE — Telephone Encounter (Signed)
Copied from CRM [id number]. Topic: Clinical - Medical Advice >> Nov 06, 2023 10:38 AM Ivette P wrote: Reason for CRM: Patient called in wanting to speak with Dr. Louanne Skye regarding some symptoms she did not want to disclose. Wants to speak to either DR or nurse directly, requesting call back at [phone].

## 2023-11-07 NOTE — Telephone Encounter (Signed)
Spoke with patient and she states that they recently found mold in her HVAC system and she needs to be tested for mold exposure. She has blisters on her face and red dots on her skin and she thinks it may be from the mold exposure. Was told she needed labs to check Immunoglobin E. Wants to know if we can do that here and if so will you order? Please advise. Advised that you are out of the office and would be back to Monday to review. Patient verbalized understanding

## 2023-11-10 NOTE — Telephone Encounter (Signed)
Patient aware and appointment scheduled.  

## 2023-11-10 NOTE — Telephone Encounter (Signed)
It is possible that we can do some of this testing, ours is not his extensive as an allergist would be.  If she does want to do it with [place] then please have her make an appointment and we can discuss what is going on and put in the appropriate labs.  She can also make an appointment with her allergist if she would prefer that as well

## 2023-11-19 ENCOUNTER — Ambulatory Visit: Payer: Medicare Other | Admitting: Family Medicine

## 2023-11-23 ENCOUNTER — Emergency Department (HOSPITAL_BASED_OUTPATIENT_CLINIC_OR_DEPARTMENT_OTHER)
Admission: EM | Admit: 2023-11-23 | Discharge: 2023-11-23 | Disposition: A | Discharge status: Home / Self Care | Payer: Medicare Other | Attending: Emergency Medicine | Admitting: Emergency Medicine

## 2023-11-23 ENCOUNTER — Other Ambulatory Visit: Payer: Self-pay

## 2023-11-23 DIAGNOSIS — Z20822 Contact with and (suspected) exposure to covid-19: Secondary | ICD-10-CM | POA: Insufficient documentation

## 2023-11-23 DIAGNOSIS — R112 Nausea with vomiting, unspecified: Secondary | ICD-10-CM | POA: Diagnosis present

## 2023-11-23 LAB — CBC WITH DIFFERENTIAL/PLATELET
Abs Immature Granulocytes: 0.02 10*3/uL (ref 0.00–0.07)
Basophils Absolute: 0 10*3/uL (ref 0.0–0.1)
Basophils Relative: 1 %
Eosinophils Absolute: 0 10*3/uL (ref 0.0–0.5)
Eosinophils Relative: 0 %
HCT: 43.6 % (ref 36.0–46.0)
Hemoglobin: 14.9 g/dL (ref 12.0–15.0)
Immature Granulocytes: 0 %
Lymphocytes Relative: 7 %
Lymphs Abs: 0.6 10*3/uL — ABNORMAL LOW (ref 0.7–4.0)
MCH: 30.6 pg (ref 26.0–34.0)
MCHC: 34.2 g/dL (ref 30.0–36.0)
MCV: 89.5 fL (ref 80.0–100.0)
Monocytes Absolute: 0.3 10*3/uL (ref 0.1–1.0)
Monocytes Relative: 4 %
Neutro Abs: 7.7 10*3/uL (ref 1.7–7.7)
Neutrophils Relative %: 88 %
Platelets: 283 10*3/uL (ref 150–400)
RBC: 4.87 MIL/uL (ref 3.87–5.11)
RDW: 12.1 % (ref 11.5–15.5)
WBC: 8.7 10*3/uL (ref 4.0–10.5)
nRBC: 0 % (ref 0.0–0.2)

## 2023-11-23 LAB — COMPREHENSIVE METABOLIC PANEL
ALT: 40 U/L (ref 0–44)
AST: 30 U/L (ref 15–41)
Albumin: 4.5 g/dL (ref 3.5–5.0)
Alkaline Phosphatase: 65 U/L (ref 38–126)
Anion gap: 9 (ref 5–15)
BUN: 11 mg/dL (ref 6–20)
CO2: 26 mmol/L (ref 22–32)
Calcium: 9.5 mg/dL (ref 8.9–10.3)
Chloride: 105 mmol/L (ref 98–111)
Creatinine, Ser: 0.66 mg/dL (ref 0.44–1.00)
GFR, Estimated: >60 mL/min (ref >60)
Glucose, Bld: 98 mg/dL (ref 70–99)
Potassium: 4 mmol/L (ref 3.5–5.1)
Sodium: 140 mmol/L (ref 135–145)
Total Bilirubin: 0.6 mg/dL (ref 0.0–1.2)
Total Protein: 7.4 g/dL (ref 6.5–8.1)

## 2023-11-23 LAB — RESP PANEL BY RT-PCR (RSV, FLU A&B, COVID)  RVPGX2
Influenza A by PCR: NEGATIVE
Influenza B by PCR: NEGATIVE
Resp Syncytial Virus by PCR: NEGATIVE
SARS Coronavirus 2 by RT PCR: NEGATIVE

## 2023-11-23 LAB — LIPASE, BLOOD: Lipase: 50 U/L (ref 11–51)

## 2023-11-23 MED ORDER — ONDANSETRON 4 MG PO TBDP: 4.0000 mg | ORAL_TABLET | Freq: Three times a day (TID) | ORAL | 0 refills | Status: DC | PRN

## 2023-11-23 MED ORDER — LACTATED RINGERS IV BOLUS
1000.0000 mL | Freq: Once | INTRAVENOUS | Status: AC
  Administered 2023-11-23: 1000 mL via INTRAVENOUS

## 2023-11-23 MED ORDER — ONDANSETRON 4 MG PO TBDP
4.0000 mg | ORAL_TABLET | ORAL | Status: DC | PRN
  Administered 2023-11-23: 4 mg via ORAL
  Filled 2023-11-23: qty 1

## 2023-11-23 MED ORDER — ONDANSETRON HCL 4 MG/2ML IJ SOLN: 4.0000 mg | Freq: Once | INTRAMUSCULAR | Status: DC

## 2023-11-23 NOTE — ED Triage Notes (Signed)
 Pt via pov from home with vomiting, chills, headache and neck pain since yesterday. Pt states she is unable to eat or drink. Pt alert & crying during triage.

## 2023-11-23 NOTE — ED Notes (Signed)
 Pt called to check temp before eating. Pt and family educated on NPO status until diet ordered by MD. Pt states she understands.

## 2023-11-23 NOTE — ED Provider Notes (Signed)
 [place] EMERGENCY DEPARTMENT AT [facility] Provider Note   CSN: [id number] Arrival date & time: 11/23/23  1710     History  Chief Complaint  Patient presents with   Emesis    Cristina Soto is a 53 y.o. female with history of anxiety, presents with concern for nausea and vomiting ongoing since yesterday.  States she has had about 5 episodes of vomiting last 24 hours.  She is throwing up her ginger ale that she drinks.  Denies any abdominal pain, fever or chills, cough.  Last bowel movement was this morning.  Denies any history of abdominal surgeries. Denies any recent travel or abnormal food consumption   Emesis      Home Medications Prior to Admission medications   Medication Sig Start Date End Date Taking? Authorizing Provider  ondansetron  (ZOFRAN -ODT) 4 MG disintegrating tablet Take 1 tablet (4 mg total) by mouth every 8 (eight) hours as needed for nausea or vomiting. 11/23/23  Yes Veta Palma, PA-C  Acetaminophen (TYLENOL) 325 MG CAPS Take by mouth as needed.    [provider]  aspirin-acetaminophen-caffeine (EXCEDRIN MIGRAINE) 250-250-65 MG tablet Take 2 tablets by mouth as needed for headache.    [provider]  buPROPion  (WELLBUTRIN ) 75 MG tablet TAKE 1 TABLET BY MOUTH TWICE A DAY Patient not taking: Reported on 03/10/2023 01/10/23   Dettinger, Fonda LABOR, MD  clindamycin -benzoyl peroxide (BENZACLIN) gel Apply topically 2 (two) times daily. 03/31/23   Christopher Savannah, PA-C  Ibuprofen (ADVIL PO) Take by mouth as needed.    [provider]  magnesium 30 MG tablet Take 30 mg by mouth 2 (two) times daily.    [provider]  progesterone  (PROMETRIUM ) 100 MG capsule Take 1 capsule (100 mg total) by mouth daily. 03/10/23   Prentiss Annabella LABOR, NP  sulfamethoxazole -trimethoprim  (BACTRIM  DS) 800-160 MG tablet Take 1 tablet by mouth 2 (two) times daily. 08/05/23   Gladis Mary-Margaret, FNP  VITAMIN D , CHOLECALCIFEROL, PO Take by mouth.     [provider]      Allergies    Corticosteroids, Erythromycin, Promethazine hcl, and Prozac  [fluoxetine ]    Review of Systems   Review of Systems  Gastrointestinal:  Positive for vomiting.    Physical Exam Updated Vital Signs BP 122/75   Pulse 94   Temp 98.4 F (36.9 C) (Oral)   Resp 16   LMP 05/22/2021   SpO2 95%  Physical Exam Vitals and nursing note reviewed.  Constitutional:      General: She is not in acute distress.    Appearance: She is well-developed.     Comments: Very well appearing, no emesis when talking to patient   HENT:     Head: Normocephalic and atraumatic.  Eyes:     Conjunctiva/sclera: Conjunctivae normal.  Cardiovascular:     Rate and Rhythm: Normal rate and regular rhythm.     Heart sounds: No murmur heard. Pulmonary:     Effort: Pulmonary effort is normal. No respiratory distress.     Breath sounds: Normal breath sounds.  Abdominal:     Palpations: Abdomen is soft.     Tenderness: There is no abdominal tenderness.     Comments: Abdomen soft nontender, no rebound or guarding  Musculoskeletal:        General: No swelling.     Cervical back: Neck supple.  Skin:    General: Skin is warm and dry.     Capillary Refill: Capillary refill takes less than 2 seconds.  Neurological:     Mental Status: She is alert.  Psychiatric:        Mood and Affect: Mood normal.     ED Results / Procedures / Treatments   Labs (all labs ordered are listed, but only abnormal results are displayed) Labs Reviewed  CBC WITH DIFFERENTIAL/PLATELET - Abnormal; Notable for the following components:      Result Value   Lymphs Abs 0.6 (*)    All other components within normal limits  RESP PANEL BY RT-PCR (RSV, FLU A&B, COVID)  RVPGX2  COMPREHENSIVE METABOLIC PANEL  LIPASE, BLOOD    EKG None  Radiology No results found.  Procedures Procedures    Medications Ordered in ED Medications  ondansetron  (ZOFRAN -ODT) disintegrating tablet 4 mg (4 mg  Oral Given 11/23/23 2102)  lactated ringers  bolus 1,000 mL (0 mLs Intravenous Stopped 11/23/23 2059)    ED Course/ Medical Decision Making/ A&P                                 Medical Decision Making Amount and/or Complexity of Data Reviewed Labs: ordered.     Differential diagnosis includes but is not limited to Cholelithiasis, cholangitis, choledocholithiasis, peptic ulcer, gastritis, gastroenteritis, appendicitis, IBS, IBD, DKA, nephrolithiasis, UTI, pyelonephritis, pancreatitis, diverticulitis, mesenteric ischemia, abdominal aortic aneurysm, small bowel obstruction, volvulus, testicular torsion, ovarian torsion, and females of childbearing age pregnancy   ED Course:  Patient well-appearing, no active emesis.  Vital signs stable.  Abdomen soft and non-tender. I discussed trying an antiemetic here for her nausea, but she states nausea medications make her feel worse.  Also declines any medications through her IV.  Will hold off on anti-emetics right now. Will start on LR bolus as patient states she has been having difficulty keeping fluids down. I Ordered, and personally interpreted labs.  The pertinent results include:  CBC without leukocytosis CMP unremarkable-no electrolyte abnormalities, no elevation LFTs, normal creatinine Lipase within normal limits COVID, flu, RSV negative Given unremarkable labs, abdomen soft nontender, normal bowel movements, low concern for any acute abdominal pathology at this time.  No indication for further imaging.  Suspect possible viral URI could be causing her nausea. Upon re-evaluation, patient still well-appearing, no episodes of emesis while in the emergency room.  Patient asked if she can eat something because she is hungry.  I told show she is able to and she had some Pedialyte while here which she was able to tolerate without difficulty.  Patient asked about norovirus testing, however, stated we do not test for this routinely in the ER.  This would  not change our management.   Patient stable and appropriate for discharge home this time.  Impression: Nausea  Disposition:  The patient was discharged home with instructions to take prescribed Zofran  as needed for nausea and vomiting.  Keep well-hydrated at home with water and electrolyte drinks such as Pedialyte. Return precautions given.             Final Clinical Impression(s) / ED Diagnoses Final diagnoses:  Nausea and vomiting in adult    Rx / DC Orders ED Discharge Orders          Ordered    ondansetron  (ZOFRAN -ODT) 4 MG disintegrating tablet  Every 8 hours PRN        11/23/23 2059              Veta Palma, PA-C 11/23/23 2103    Cottie Cough  J, MD 11/23/23 2233

## 2023-11-23 NOTE — Discharge Instructions (Addendum)
 Your COVID, flu, and RSV testing is negative.  Your blood counts are normal.  Your kidney, liver, and pancreas labs are normal.  Your electrolytes are also normal.  You have been prescribed Zofran  to use up to every 8 hours as needed for nausea and vomiting.  Please continue to stay well-hydrated at home with water and electrolyte drinks such as Pedialyte.  Return the ER for any severe abdominal pain, uncontrolled nausea or vomiting, any other new or concerning symptoms.

## 2023-11-25 IMAGING — MG DIGITAL DIAGNOSTIC UNILAT LEFT W/ CAD
3 series · 3 of 3 positions shown · non-contrast
Comparison: Previous exam(s).

CLINICAL DATA: Patient recalled from screening for left breast
calcifications.

EXAM:
DIGITAL DIAGNOSTIC UNILATERAL LEFT MAMMOGRAM WITH CAD
TECHNIQUE: Left digital diagnostic mammography was performed. Mammographic
images were processed with CAD.

[L ML (1 of 2)]
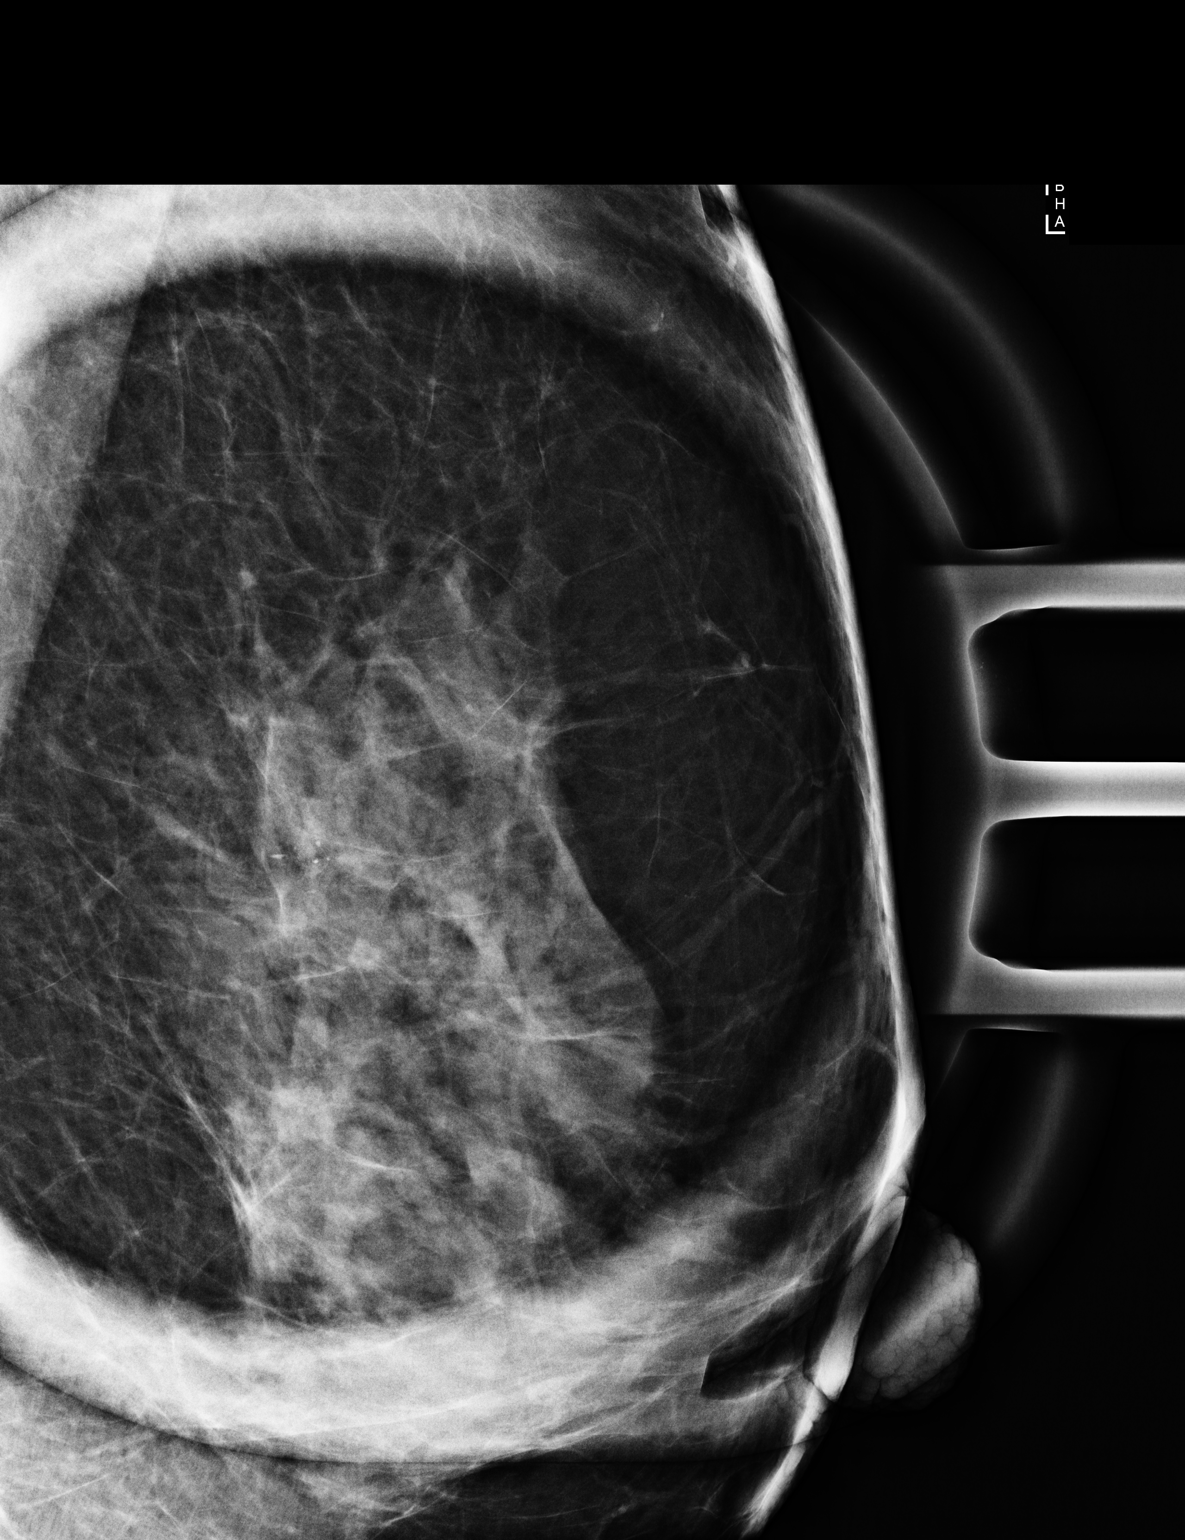

[L ML (2 of 2)]
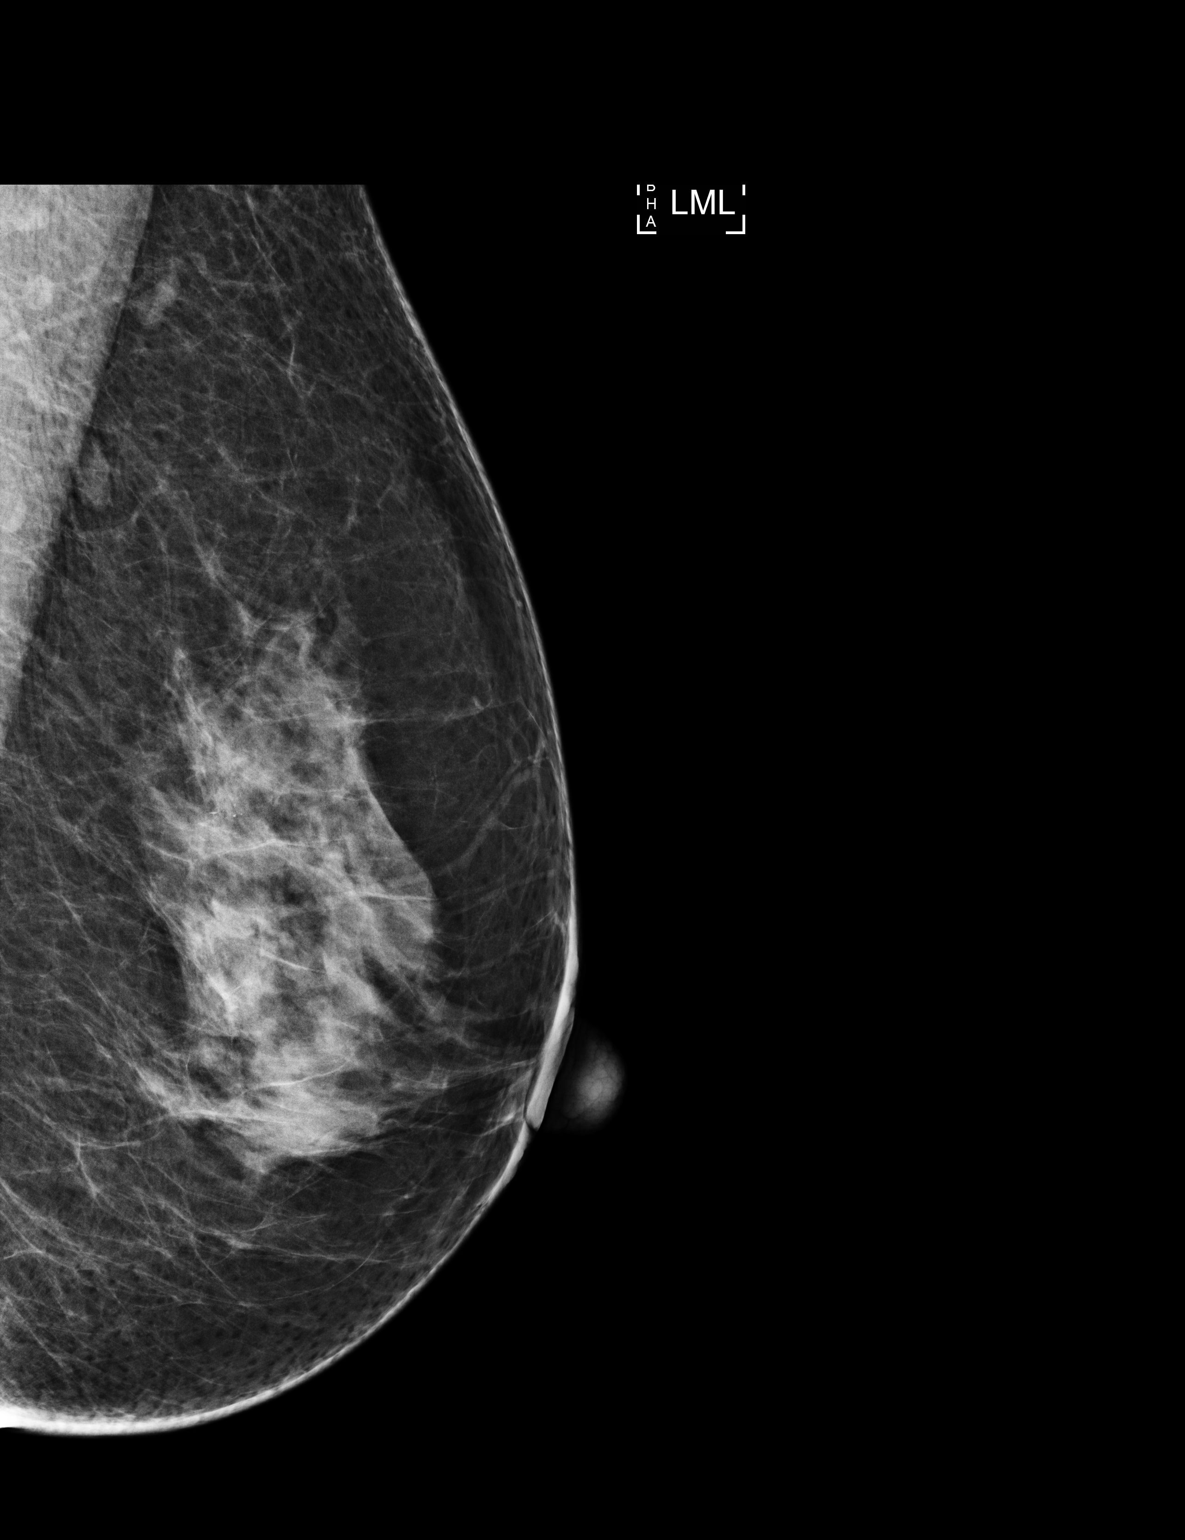

[L CC]
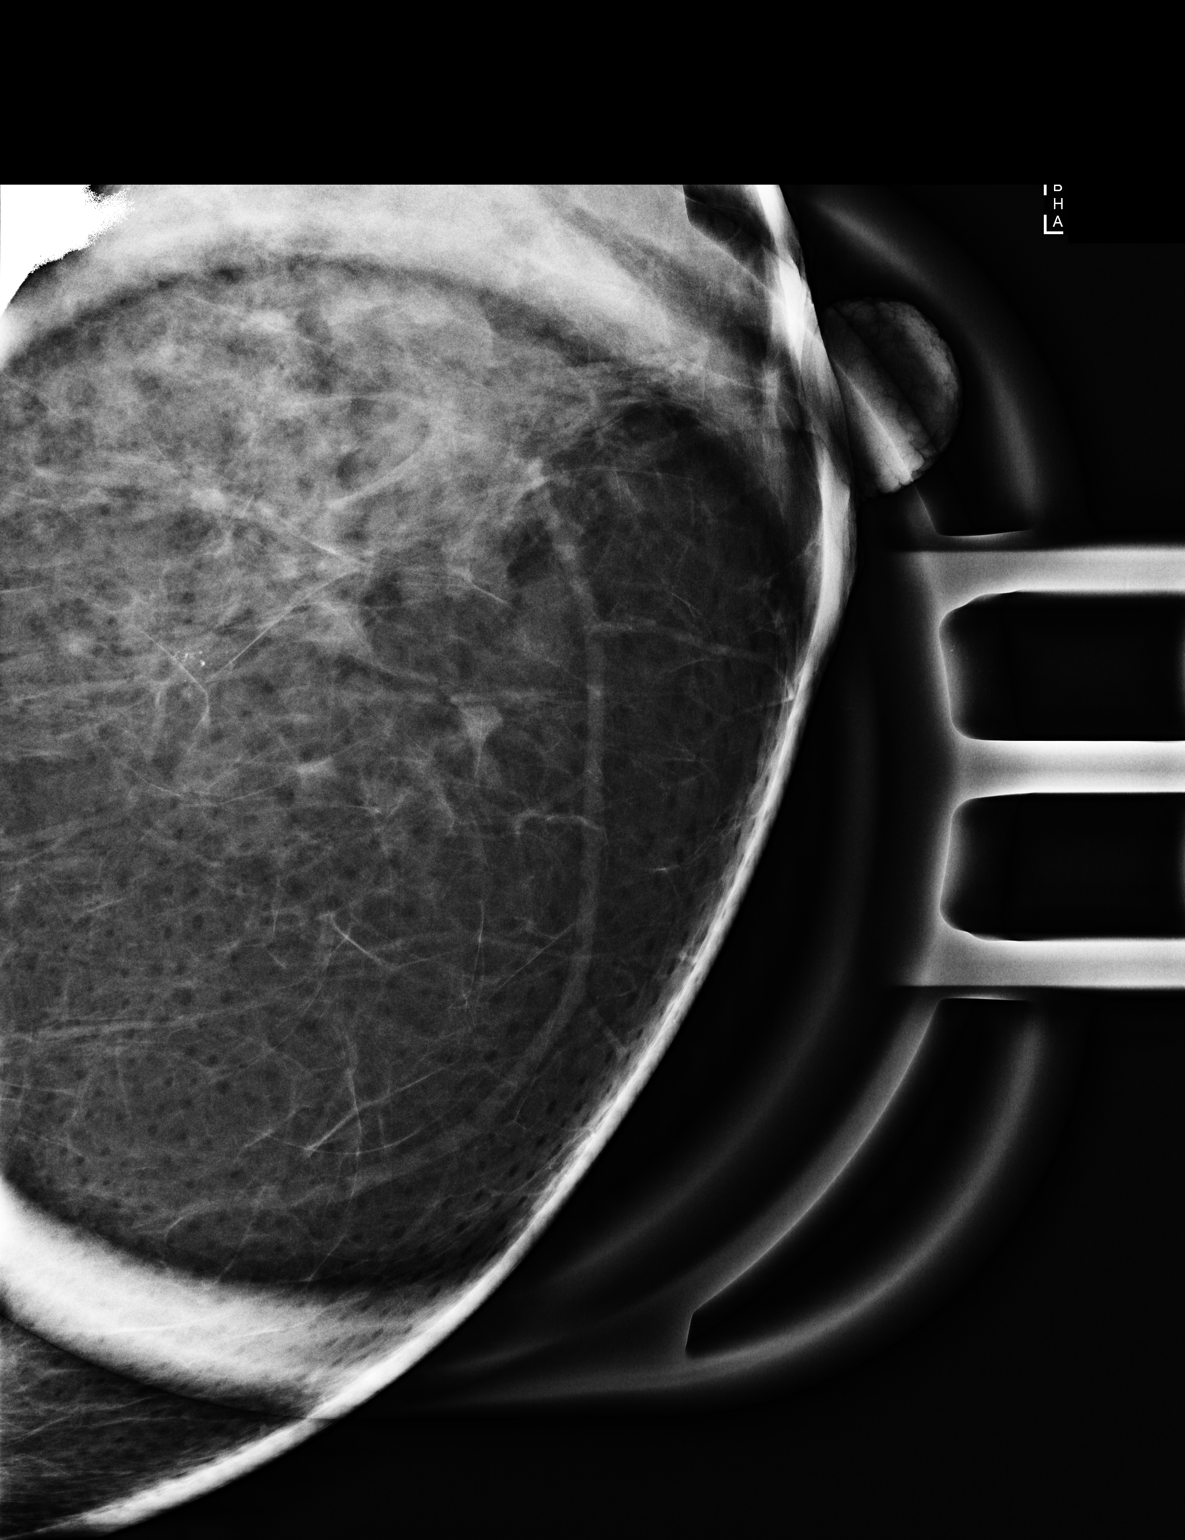

[3 of 3 positions shown; findings below may reference images not displayed]

ACR Breast Density Category c: The breast tissue is heterogeneously
dense, which may obscure small masses.
FINDINGS: Magnification views of the left breast were obtained. Within the
upper inner left breast there is a 5 mm group of coarse
heterogeneous calcifications.
IMPRESSION: Indeterminate left breast calcifications.

RECOMMENDATION:
Stereotactic guided core needle biopsy left breast calcifications.

I have discussed the findings and recommendations with the patient.
If applicable, a reminder letter will be sent to the patient
regarding the next appointment.

BI-RADS CATEGORY  4: Suspicious.

## 2023-11-27 ENCOUNTER — Telehealth: Payer: Self-pay | Admitting: *Deleted

## 2023-11-27 ENCOUNTER — Encounter: Payer: Medicare Other | Attending: Psychology | Admitting: Psychology

## 2023-11-27 DIAGNOSIS — F39 Unspecified mood [affective] disorder: Secondary | ICD-10-CM | POA: Diagnosis not present

## 2023-11-27 DIAGNOSIS — M797 Fibromyalgia: Secondary | ICD-10-CM | POA: Diagnosis not present

## 2023-11-27 DIAGNOSIS — Z6379 Other stressful life events affecting family and household: Secondary | ICD-10-CM | POA: Diagnosis not present

## 2023-11-27 DIAGNOSIS — G43001 Migraine without aura, not intractable, with status migrainosus: Secondary | ICD-10-CM | POA: Diagnosis not present

## 2023-11-27 DIAGNOSIS — F410 Panic disorder [episodic paroxysmal anxiety] without agoraphobia: Secondary | ICD-10-CM | POA: Insufficient documentation

## 2023-11-27 DIAGNOSIS — F419 Anxiety disorder, unspecified: Secondary | ICD-10-CM | POA: Insufficient documentation

## 2023-11-27 DIAGNOSIS — F0630 Mood disorder due to known physiological condition, unspecified: Secondary | ICD-10-CM | POA: Diagnosis not present

## 2023-11-27 DIAGNOSIS — F064 Anxiety disorder due to known physiological condition: Secondary | ICD-10-CM | POA: Diagnosis present

## 2023-11-27 NOTE — Progress Notes (Signed)
Transition Care Management Unsuccessful Follow-up Telephone Call  Date of discharge and from where:  [facility]  11/23/2023  Attempts:  1st Attempt  Reason for unsuccessful TCM follow-up call:  no answer

## 2023-11-28 ENCOUNTER — Encounter: Payer: Self-pay | Admitting: Psychology
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
## 2023-12-02 ENCOUNTER — Telehealth: Payer: Self-pay | Admitting: *Deleted

## 2023-12-02 NOTE — Progress Notes (Signed)
Transition Care Management Unsuccessful Follow-up Telephone Call  Date of discharge and from where:  [facility]  11/23/2023  Attempts:  2nd Attempt  Reason for unsuccessful TCM follow-up call:  No answer/busy

## 2024-01-29 NOTE — Progress Notes (Signed)
 This encounter was created in error - please disregard.

## 2024-02-19 ENCOUNTER — Encounter: Payer: Medicare Other | Attending: Psychology | Admitting: Psychology

## 2024-02-19 DIAGNOSIS — M799 Soft tissue disorder, unspecified: Secondary | ICD-10-CM | POA: Diagnosis not present

## 2024-02-19 DIAGNOSIS — F0630 Mood disorder due to known physiological condition, unspecified: Secondary | ICD-10-CM | POA: Diagnosis not present

## 2024-02-19 DIAGNOSIS — G43001 Migraine without aura, not intractable, with status migrainosus: Secondary | ICD-10-CM | POA: Insufficient documentation

## 2024-02-19 DIAGNOSIS — F39 Unspecified mood [affective] disorder: Secondary | ICD-10-CM | POA: Diagnosis present

## 2024-02-19 DIAGNOSIS — F064 Anxiety disorder due to known physiological condition: Secondary | ICD-10-CM | POA: Diagnosis not present

## 2024-03-02 ENCOUNTER — Encounter: Payer: Self-pay | Admitting: Psychology

## 2024-03-02 ENCOUNTER — Encounter: Payer: Self-pay | Admitting: Nurse Practitioner
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
## 2024-05-05 ENCOUNTER — Other Ambulatory Visit: Payer: Self-pay | Admitting: Nurse Practitioner

## 2024-05-05 DIAGNOSIS — N63 Unspecified lump in unspecified breast: Secondary | ICD-10-CM

## 2024-05-05 DIAGNOSIS — Z09 Encounter for follow-up examination after completed treatment for conditions other than malignant neoplasm: Secondary | ICD-10-CM

## 2024-05-06 ENCOUNTER — Other Ambulatory Visit: Payer: Self-pay | Admitting: Nurse Practitioner

## 2024-05-06 DIAGNOSIS — R920 Mammographic microcalcification found on diagnostic imaging of breast: Secondary | ICD-10-CM

## 2024-05-06 DIAGNOSIS — Z09 Encounter for follow-up examination after completed treatment for conditions other than malignant neoplasm: Secondary | ICD-10-CM

## 2024-05-21 ENCOUNTER — Ambulatory Visit
Admission: RE | Admit: 2024-05-21 | Discharge: 2024-05-21 | Disposition: A | Discharge status: Home / Self Care | Source: Ambulatory Visit | Attending: Nurse Practitioner

## 2024-05-21 DIAGNOSIS — Z09 Encounter for follow-up examination after completed treatment for conditions other than malignant neoplasm: Secondary | ICD-10-CM

## 2024-05-21 DIAGNOSIS — R920 Mammographic microcalcification found on diagnostic imaging of breast: Secondary | ICD-10-CM

## 2024-05-26 ENCOUNTER — Encounter

## 2024-07-08 ENCOUNTER — Encounter: Payer: Self-pay | Admitting: Psychology

## 2024-07-08 ENCOUNTER — Encounter: Payer: Medicare Other | Attending: Psychology | Admitting: Psychology

## 2024-07-08 DIAGNOSIS — G43001 Migraine without aura, not intractable, with status migrainosus: Secondary | ICD-10-CM | POA: Diagnosis not present

## 2024-07-08 DIAGNOSIS — F064 Anxiety disorder due to known physiological condition: Secondary | ICD-10-CM

## 2024-07-08 DIAGNOSIS — F0630 Mood disorder due to known physiological condition, unspecified: Secondary | ICD-10-CM
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
## 2024-08-12 ENCOUNTER — Ambulatory Visit: Admitting: Psychology

## 2024-08-19 ENCOUNTER — Ambulatory Visit: Admitting: Nurse Practitioner

## 2024-08-19 ENCOUNTER — Encounter: Payer: Self-pay | Admitting: Nurse Practitioner

## 2024-08-19 VITALS — BP 118/78 | HR 89

## 2024-08-19 DIAGNOSIS — N952 Postmenopausal atrophic vaginitis: Secondary | ICD-10-CM

## 2024-08-19 DIAGNOSIS — N941 Unspecified dyspareunia: Secondary | ICD-10-CM

## 2024-08-19 DIAGNOSIS — R300 Dysuria: Secondary | ICD-10-CM

## 2024-08-19 MED ORDER — ESTRADIOL 0.01 % VA CREA
1.0000 g | TOPICAL_CREAM | VAGINAL | 1 refills | Status: AC
Start: 2024-08-19 — End: ?

## 2024-08-19 NOTE — Progress Notes (Signed)
   Acute Office Visit  Subjective:    Patient ID: Cristina Soto, female    DOB: 31-Aug-1971, 53 y.o.   MRN: [id number]   HPI 53 y.o. presents today for painful intercourse. Boyfriend lives in [place] , so intercourse is infrequent. Has some discomfort during intercourse. Feels irritation around the urethra for a couple of weeks after. Denies urgency, frequency, hematuria, vaginal discharge, itching. Requesting UA today.   Patient's last menstrual period was 05/22/2021.    Review of Systems  Constitutional: Negative.   Genitourinary:  Positive for dyspareunia and dysuria. Negative for flank pain, frequency, genital sores, hematuria, urgency, vaginal discharge and vaginal pain.       Objective:    Physical Exam Constitutional:      Appearance: Normal appearance.  Genitourinary:    General: Normal vulva.     Vagina: Normal.     Cervix: Normal.     Comments: Atrophic changes    BP 118/78   Pulse 89   LMP 05/22/2021   SpO2 99%  Wt Readings from Last 3 Encounters:  03/10/23 148 lb (67.1 kg)  10/16/22 138 lb (62.6 kg)  10/14/22 140 lb (63.5 kg)        Zada Louder, CMA present as chaperone.   UA negative  Assessment & Plan:   Problem List Items Addressed This Visit   None Visit Diagnoses       Dyspareunia in female    -  Primary   Relevant Medications   estradiol  (ESTRACE ) 0.01 % CREA vaginal cream     Vaginal atrophy       Relevant Medications   estradiol  (ESTRACE ) 0.01 % CREA vaginal cream     Burning with urination       Relevant Orders   Urinalysis,Complete w/RFL Culture      Plan: UA unremarkable. Discussed atrophic changes that occur with menopause and management options. Would like to try vaginal estrogen. Educated on use. Recommend silicone lubricants with intercourse.    Return if symptoms worsen or fail to improve.    Annabella DELENA Shutter DNP, 12:38 PM 08/19/2024

## 2024-08-20 NOTE — Telephone Encounter (Signed)
 Patient notified & I explained to her what a urine culture is & how it identifies an infection. Patient understands & appreciates the information. She is aware we will notify her of the urine culture results when they are available and the provider has reviewed it.

## 2024-08-20 NOTE — Telephone Encounter (Signed)
 Please call patient to let her know that she has a culture pending. If there is an infection it was will show up then. Normal to have trace amounts of hemoglobin in urine. Very small amount of WBCs are present but does not always indicate an infection. Culture will tell us  more.

## 2024-08-22 LAB — URINALYSIS, COMPLETE W/RFL CULTURE
Bacteria, UA: NONE SEEN /HPF
Bilirubin Urine: NEGATIVE
Glucose, UA: NEGATIVE
Hyaline Cast: NONE SEEN /LPF
Ketones, ur: NEGATIVE
Nitrites, Initial: NEGATIVE
Protein, ur: NEGATIVE
Specific Gravity, Urine: 1.015 (ref 1.001–1.035)
pH: 5.5 (ref 5.0–8.0)

## 2024-08-22 LAB — URINE CULTURE
MICRO NUMBER:: 17078693
SPECIMEN QUALITY:: ADEQUATE

## 2024-08-22 LAB — CULTURE INDICATED

## 2024-08-23 ENCOUNTER — Other Ambulatory Visit: Payer: Self-pay | Admitting: Nurse Practitioner

## 2024-08-23 ENCOUNTER — Ambulatory Visit: Payer: Self-pay | Admitting: Nurse Practitioner

## 2024-08-23 DIAGNOSIS — N3000 Acute cystitis without hematuria: Secondary | ICD-10-CM

## 2024-08-23 MED ORDER — SULFAMETHOXAZOLE-TRIMETHOPRIM 800-160 MG PO TABS: 1.0000 | ORAL_TABLET | Freq: Two times a day (BID) | ORAL | 0 refills | Status: AC

## 2024-08-23 NOTE — Telephone Encounter (Signed)
 Spoke with patient. Questions answered. Will start abx today.   Encounter closed.

## 2024-08-24 ENCOUNTER — Encounter: Attending: Psychology | Admitting: Psychology

## 2024-08-24 DIAGNOSIS — F0630 Mood disorder due to known physiological condition, unspecified: Secondary | ICD-10-CM | POA: Diagnosis present

## 2024-08-24 DIAGNOSIS — F064 Anxiety disorder due to known physiological condition: Secondary | ICD-10-CM | POA: Diagnosis not present

## 2024-08-24 DIAGNOSIS — G43001 Migraine without aura, not intractable, with status migrainosus: Secondary | ICD-10-CM | POA: Diagnosis not present

## 2024-08-25 ENCOUNTER — Encounter: Payer: Self-pay | Admitting: Psychology
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
## 2024-10-11 ENCOUNTER — Telehealth: Payer: Self-pay

## 2024-10-11 NOTE — Telephone Encounter (Signed)
 Copied from CRM #[id number]. Topic: Clinical - Medication Question >> Oct 11, 2024  2:28 PM Delon T wrote: Reason for CRM: patient asking for prescription for triamcinolone  (KENALOG ) 0.1 % paste , she is out of town- [phone]- have an issue with cankers in mouth

## 2024-10-12 ENCOUNTER — Encounter: Admitting: Nurse Practitioner

## 2024-10-12 NOTE — Telephone Encounter (Signed)
 Pt states that she is in [place] and will need the script sent to [facility] in [place].  Pt states that it is Kenalog paste not cream. She uses the paste 2-3 times per day when she gets the canker sores.

## 2024-10-12 NOTE — Telephone Encounter (Signed)
 We usually do not use triamcinolone  cream for canker sores. Also can ony send in prescriptions in [place] and [place]

## 2024-10-13 ENCOUNTER — Other Ambulatory Visit: Payer: Self-pay | Admitting: Family Medicine

## 2024-10-13 MED ORDER — TRIAMCINOLONE ACETONIDE 0.1 % MT PSTE: 1.0000 | PASTE | Freq: Two times a day (BID) | OROMUCOSAL | 12 refills | Status: AC

## 2024-10-13 NOTE — Telephone Encounter (Signed)
Left message making pt aware of Dr. Merita Norton recommendations and to call back if needed.

## 2024-10-13 NOTE — Telephone Encounter (Signed)
 Who prescribed this for her, we typically do not use Kenalog in the mouth because steroids on thin skin are not a good thing to use?  Orajel is an option that I commonly recommend for people and that is over-the-counter?

## 2024-10-13 NOTE — Progress Notes (Unsigned)
 I found the oral version of it and maybe she could try this, never prescribed this before

## 2024-11-08 ENCOUNTER — Encounter: Attending: Psychology | Admitting: Psychology

## 2024-11-08 DIAGNOSIS — G43001 Migraine without aura, not intractable, with status migrainosus: Secondary | ICD-10-CM | POA: Insufficient documentation

## 2024-11-08 DIAGNOSIS — F0630 Mood disorder due to known physiological condition, unspecified: Secondary | ICD-10-CM | POA: Insufficient documentation

## 2024-11-08 DIAGNOSIS — F064 Anxiety disorder due to known physiological condition: Secondary | ICD-10-CM | POA: Diagnosis present

## 2024-11-09 ENCOUNTER — Encounter: Payer: Self-pay | Admitting: Psychology
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
## 2024-11-10 ENCOUNTER — Encounter: Payer: Self-pay | Admitting: Psychology

## 2024-11-18 ENCOUNTER — Ambulatory Visit: Admitting: Psychology

## 2024-12-28 ENCOUNTER — Encounter: Admitting: Psychology

## 2025-01-03 ENCOUNTER — Encounter: Admitting: Nurse Practitioner

## 2025-02-02 ENCOUNTER — Encounter: Attending: Psychology | Admitting: Psychology
